# Patient Record
Sex: Female | Born: 1995 | Race: White | Hispanic: No | Marital: Married | Smoking: Never smoker
Health system: Southern US, Community
[De-identification: ages and names within clinical notes are randomized; demographics above are authoritative.]

## PROBLEM LIST (undated history)

## (undated) DIAGNOSIS — K219 Gastro-esophageal reflux disease without esophagitis: Secondary | ICD-10-CM

## (undated) DIAGNOSIS — R51 Headache: Secondary | ICD-10-CM

## (undated) DIAGNOSIS — Z23 Encounter for immunization: Secondary | ICD-10-CM

## (undated) DIAGNOSIS — R519 Headache, unspecified: Secondary | ICD-10-CM

## (undated) DIAGNOSIS — R112 Nausea with vomiting, unspecified: Secondary | ICD-10-CM

## (undated) DIAGNOSIS — K259 Gastric ulcer, unspecified as acute or chronic, without hemorrhage or perforation: Secondary | ICD-10-CM

## (undated) DIAGNOSIS — M2550 Pain in unspecified joint: Secondary | ICD-10-CM

## (undated) DIAGNOSIS — K828 Other specified diseases of gallbladder: Secondary | ICD-10-CM

## (undated) DIAGNOSIS — J302 Other seasonal allergic rhinitis: Secondary | ICD-10-CM

## (undated) DIAGNOSIS — Z9889 Other specified postprocedural states: Secondary | ICD-10-CM

## (undated) DIAGNOSIS — Z8489 Family history of other specified conditions: Secondary | ICD-10-CM

## (undated) DIAGNOSIS — Z8669 Personal history of other diseases of the nervous system and sense organs: Secondary | ICD-10-CM

## (undated) HISTORY — PX: HIP SURGERY: SHX245

## (undated) HISTORY — DX: Encounter for immunization: Z23

## (undated) HISTORY — PX: COLONOSCOPY WITH ESOPHAGOGASTRODUODENOSCOPY (EGD): SHX5779

## (undated) HISTORY — PX: SHOULDER SURGERY: SHX246

---
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
## 2018-09-08 ENCOUNTER — Other Ambulatory Visit: Payer: Self-pay | Admitting: Neurology

## 2018-09-08 DIAGNOSIS — G44041 Chronic paroxysmal hemicrania, intractable: Secondary | ICD-10-CM

## 2018-09-22 ENCOUNTER — Ambulatory Visit
Admission: RE | Admit: 2018-09-22 | Discharge: 2018-09-22 | Disposition: A | Discharge status: Home / Self Care | Payer: BC Managed Care – PPO | Source: Ambulatory Visit | Attending: Neurology | Admitting: Neurology

## 2018-09-22 ENCOUNTER — Other Ambulatory Visit: Payer: Self-pay

## 2018-09-22 DIAGNOSIS — G44041 Chronic paroxysmal hemicrania, intractable: Secondary | ICD-10-CM | POA: Diagnosis present

## 2018-11-01 ENCOUNTER — Other Ambulatory Visit: Payer: Self-pay

## 2018-11-01 DIAGNOSIS — Z20822 Contact with and (suspected) exposure to covid-19: Secondary | ICD-10-CM

## 2018-11-05 LAB — NOVEL CORONAVIRUS, NAA: SARS-CoV-2, NAA: NOT DETECTED

## 2019-05-10 ENCOUNTER — Ambulatory Visit: Payer: Self-pay | Admitting: Obstetrics and Gynecology

## 2019-05-12 NOTE — Progress Notes (Signed)
PCP:  Luna Fuse, MD   Chief Complaint  Patient presents with  . Gynecologic Exam     HPI:      [name] is a 24 y.o. No obstetric history on file. who LMP was Patient's last menstrual period was 04/28/2019 (approximate)., presents today for her NP annual examination.  Her menses are regular every 28-30 days, lasting 4 days.  Dysmenorrhea none. She does not have intermenstrual bleeding.  Sex activity: not currently but has in past - OCP (estrogen/progesterone).  Last Pap: never Hx of STDs: none  Pt has had issues with yeast vag over the past yr. Usually has d/c with irritation, sometimes odor. Treated with monistat, sometimes with relief. No sx today. Uses regular soap, no dryer sheets, no thong use, no wipes.   There is a FH of breast cancer in [facility] and 2 pat 2nd cousins, genetic testing not indicated for pt. There is no FH of ovarian cancer. The patient does do self-breast exams.  Tobacco use: The patient denies current or previous tobacco use. Alcohol use: none No drug use.  Exercise: moderately active. Recently had hip surgery so plans to be active again after recovery.  She does get adequate calcium but not Vitamin D in her diet. Gardasil completed.   Pt with anxiety/depression sx, on zoloft 50 mg daily. Having increased anxiety and depression since moving to [place] for grad school and boyfriend deployed. Doesn't feel dose is controlling sx. No side effects. Wants [place] to manage since had been seeing peds.   Past Medical History:  Diagnosis Date  . Biliary dyskinesia 01/25/2016  . Family history of adverse reaction to anesthesia    dad gets sick with anesthesia  . Gastric ulcer   . GERD (gastroesophageal reflux disease)    takes Pantoprazole daily  . Headache   . History of migraine    few yrs ago  . Joint pain   . PONV (postoperative nausea and vomiting)   . Seasonal allergies    Tylenol Sinus and Nasonex as needed  . Vaccine for human papilloma virus  (HPV) types 6, 11, 16, and 18 administered      Past Surgical History:  Procedure Laterality Date  . CHOLECYSTECTOMY N/A 01/25/2016   Procedure: LAPAROSCOPIC CHOLECYSTECTOMY WITH INTRAOPERATIVE CHOLANGIOGRAM;  Surgeon: Fanny Skates, MD;  Location: [facility];  Service: General;  Laterality: N/A;  . COLONOSCOPY WITH ESOPHAGOGASTRODUODENOSCOPY (EGD)    . HIP SURGERY Bilateral    labrum repair  . SHOULDER SURGERY Bilateral    labrum repair    History reviewed. No pertinent family history.  Social History   Socioeconomic History  . Marital status: Single    Spouse name: Not on file  . Number of children: Not on file  . Years of education: Not on file  . Highest education level: Not on file  Occupational History  . Not on file  Tobacco Use  . Smoking status: Never Smoker  . Smokeless tobacco: Never Used  Substance and Sexual Activity  . Alcohol use: No  . Drug use: No  . Sexual activity: Not Currently    Birth control/protection: Pill  Other Topics Concern  . Not on file  Social History Narrative  . Not on file   Social Determinants of Health   Financial Resource Strain:   . Difficulty of Paying Living Expenses: Not on file  Food Insecurity:   . Worried About Charity fundraiser in the Last Year: Not on file  .  Ran Out of Food in the Last Year: Not on file  Transportation Needs:   . Lack of Transportation (Medical): Not on file  . Lack of Transportation (Non-Medical): Not on file  Physical Activity:   . Days of Exercise per Week: Not on file  . Minutes of Exercise per Session: Not on file  Stress:   . Feeling of Stress : Not on file  Social Connections:   . Frequency of Communication with Friends and Family: Not on file  . Frequency of Social Gatherings with Friends and Family: Not on file  . Attends Religious Services: Not on file  . Active Member of Clubs or Organizations: Not on file  . Attends Banker Meetings: Not on file  . Marital Status: Not  on file  Intimate Partner Violence:   . Fear of Current or Ex-Partner: Not on file  . Emotionally Abused: Not on file  . Physically Abused: Not on file  . Sexually Abused: Not on file     Current Outpatient Medications:  .  cetirizine (ZYRTEC) 10 MG tablet, Take 10 mg by mouth daily as needed for allergies. , Disp: , Rfl:  .  Cyanocobalamin (VITAMIN B-12 PO), Take 1 tablet by mouth daily., Disp: , Rfl:  .  Melatonin 3 MG TABS, Take by mouth., Disp: , Rfl:  .  mometasone (NASONEX) 50 MCG/ACT nasal spray, Place 2 sprays into the nose daily as needed (allergies)., Disp: , Rfl:  .  ondansetron (ZOFRAN) 4 MG tablet, Take 4 mg by mouth daily as needed., Disp: , Rfl:  .  pantoprazole (PROTONIX) 40 MG tablet, Take by mouth., Disp: , Rfl:  .  pseudoephedrine-acetaminophen (TYLENOL SINUS) 30-500 MG TABS tablet, Take 2 tablets by mouth 2 (two) times daily as needed (cold symptoms)., Disp: , Rfl:  .  sertraline (ZOLOFT) 50 MG tablet, Take 1.5 tablets (75 mg total) by mouth daily., Disp: 135 tablet, Rfl: 3 .  SPRINTEC 28 0.25-35 MG-MCG tablet, Take 1 tablet by mouth daily., Disp: 3 Package, Rfl: 3 .  sucralfate (CARAFATE) 1 g tablet, Take 1 g by mouth 3 (three) times daily as needed., Disp: , Rfl:      ROS:  Review of Systems  Constitutional: Negative for fatigue, fever and unexpected weight change.  Respiratory: Negative for cough, shortness of breath and wheezing.   Cardiovascular: Negative for chest pain, palpitations and leg swelling.  Gastrointestinal: Negative for blood in stool, constipation, diarrhea, nausea and vomiting.  Endocrine: Negative for cold intolerance, heat intolerance and polyuria.  Genitourinary: Positive for vaginal discharge. Negative for dyspareunia, dysuria, flank pain, frequency, genital sores, hematuria, menstrual problem, pelvic pain, urgency, vaginal bleeding and vaginal pain.  Musculoskeletal: Negative for back pain, joint swelling and myalgias.  Skin: Negative for  rash.  Neurological: Negative for dizziness, syncope, light-headedness, numbness and headaches.  Hematological: Negative for adenopathy.  Psychiatric/Behavioral: Positive for agitation and dysphoric mood. Negative for confusion, sleep disturbance and suicidal ideas. The patient is not nervous/anxious.   BREAST: No symptoms   Objective: BP 90/60   Ht 5\' 5"  (1.651 m)   Wt 143 lb (64.9 kg)   LMP 04/28/2019 (Approximate)   BMI 23.80 kg/m    Physical Exam Constitutional:      Appearance: She is well-developed.  Genitourinary:     Vulva, vagina, cervix, uterus, right adnexa and left adnexa normal.     No vulval lesion or tenderness noted.     No vaginal discharge, erythema or tenderness.  No cervical polyp.     Uterus is not enlarged or tender.     No right or left adnexal mass present.     Right adnexa not tender.     Left adnexa not tender.  Neck:     Thyroid: No thyromegaly.  Cardiovascular:     Rate and Rhythm: Normal rate and regular rhythm.     Heart sounds: Normal heart sounds. No murmur.  Pulmonary:     Effort: Pulmonary effort is normal.     Breath sounds: Normal breath sounds.  Chest:     Breasts:        Right: No mass, nipple discharge, skin change or tenderness.        Left: No mass, nipple discharge, skin change or tenderness.  Abdominal:     Palpations: Abdomen is soft.     Tenderness: There is no abdominal tenderness. There is no guarding.  Musculoskeletal:        General: Normal range of motion.     Cervical back: Normal range of motion.  Neurological:     General: No focal deficit present.     Mental Status: She is alert and oriented to person, place, and time.     Cranial Nerves: No cranial nerve deficit.  Skin:    General: Skin is warm and dry.  Psychiatric:        Mood and Affect: Mood normal.        Behavior: Behavior normal.        Thought Content: Thought content normal.        Judgment: Judgment normal.  Vitals reviewed.     Results:  GAD 7 : Generalized Anxiety Score 05/13/2019  Nervous, Anxious, on Edge 1  Control/stop worrying 2  Worry too much - different things 2  Trouble relaxing 2  Restless 1  Easily annoyed or irritable 0  Afraid - awful might happen 1  Total GAD 7 Score 9  Anxiety Difficulty Very difficult    Depression screen PHQ 2/9 05/13/2019  Decreased Interest 1  Down, Depressed, Hopeless 2  PHQ - 2 Score 3  Altered sleeping 3  Tired, decreased energy 2  Change in appetite 3  Feeling bad or failure about yourself  2  Trouble concentrating 1  Moving slowly or fidgety/restless 0  Suicidal thoughts 0  PHQ-9 Score 14  Difficult doing work/chores Very difficult    Results for orders placed or performed in visit on 05/13/19 (from the past 24 hour(s))  POCT Wet Prep with KOH     Status: Normal   Collection Time: 05/13/19 11:20 AM  Result Value Ref Range   Trichomonas, UA Negative    Clue Cells Wet Prep HPF POC neg    Epithelial Wet Prep HPF POC     Yeast Wet Prep HPF POC neg    Bacteria Wet Prep HPF POC     RBC Wet Prep HPF POC     WBC Wet Prep HPF POC     KOH Prep POC Negative Negative     Assessment/Plan: Encounter for annual routine gynecological examination  Cervical cancer screening - Plan: Cytology - PAP  Screening for STD (sexually transmitted disease) - Plan: Cytology - PAP  Encounter for surveillance of contraceptive pills - Plan: SPRINTEC 28 0.25-35 MG-MCG tablet; OCP RF.  Anxiety and depression - Plan: sertraline (ZOLOFT) 50 MG tablet, Increase to 75 mg dose for 4 wks. If sx controlled, cont dose. If still not improved, will increase to 100  mg dose. Pt to f/u via phone/mychart since in [place]. Rx eRxd.   Vaginal discharge - Plan: POCT Wet Prep with KOH; neg wet prep. No sx today anyway. Change to dove sens skin soap. F/u prn. If gets BV sx, will treat empirically since in [place]. If no relief, then pt can f/u with Gyn there.   Meds ordered this encounter  Medications  . SPRINTEC 28  0.25-35 MG-MCG tablet    Sig: Take 1 tablet by mouth daily.    Dispense:  3 Package    Refill:  3    Order Specific Question:   Supervising Provider    Answer:   Nadara Mustard [id number]  . sertraline (ZOLOFT) 50 MG tablet    Sig: Take 1.5 tablets (75 mg total) by mouth daily.    Dispense:  135 tablet    Refill:  3    Order Specific Question:   Supervising Provider    Answer:   Nadara Mustard [[id number]]             GYN counsel adequate intake of calcium and vitamin D, diet and exercise     F/U  Return in about 1 year (around 05/12/2020).  Brentlee Delage B. Consuelo Suthers, PA-C 05/13/2019 12:01 PM

## 2019-05-13 ENCOUNTER — Other Ambulatory Visit (HOSPITAL_COMMUNITY)
Admission: RE | Admit: 2019-05-13 | Discharge: 2019-05-13 | Disposition: A | Discharge status: Home / Self Care | Payer: BC Managed Care – PPO | Source: Ambulatory Visit | Attending: Obstetrics and Gynecology | Admitting: Obstetrics and Gynecology

## 2019-05-13 ENCOUNTER — Encounter: Payer: Self-pay | Admitting: Obstetrics and Gynecology

## 2019-05-13 ENCOUNTER — Ambulatory Visit (INDEPENDENT_AMBULATORY_CARE_PROVIDER_SITE_OTHER): Payer: BC Managed Care – PPO | Admitting: Obstetrics and Gynecology

## 2019-05-13 ENCOUNTER — Other Ambulatory Visit: Payer: Self-pay

## 2019-05-13 VITALS — BP 90/60 | Ht 65.0 in | Wt 143.0 lb

## 2019-05-13 DIAGNOSIS — Z124 Encounter for screening for malignant neoplasm of cervix: Secondary | ICD-10-CM | POA: Diagnosis not present

## 2019-05-13 DIAGNOSIS — Z113 Encounter for screening for infections with a predominantly sexual mode of transmission: Secondary | ICD-10-CM | POA: Insufficient documentation

## 2019-05-13 DIAGNOSIS — Z01419 Encounter for gynecological examination (general) (routine) without abnormal findings: Secondary | ICD-10-CM

## 2019-05-13 DIAGNOSIS — N898 Other specified noninflammatory disorders of vagina: Secondary | ICD-10-CM | POA: Diagnosis not present

## 2019-05-13 DIAGNOSIS — Z3041 Encounter for surveillance of contraceptive pills: Secondary | ICD-10-CM

## 2019-05-13 DIAGNOSIS — F419 Anxiety disorder, unspecified: Secondary | ICD-10-CM | POA: Diagnosis not present

## 2019-05-13 DIAGNOSIS — F32A Depression, unspecified: Secondary | ICD-10-CM | POA: Insufficient documentation

## 2019-05-13 DIAGNOSIS — F329 Major depressive disorder, single episode, unspecified: Secondary | ICD-10-CM | POA: Insufficient documentation

## 2019-05-13 LAB — POCT WET PREP WITH KOH
Clue Cells Wet Prep HPF POC: NEGATIVE
KOH Prep POC: NEGATIVE
Trichomonas, UA: NEGATIVE
Yeast Wet Prep HPF POC: NEGATIVE

## 2019-05-13 MED ORDER — SPRINTEC 28 0.25-35 MG-MCG PO TABS: 1.0000 | ORAL_TABLET | Freq: Every day | ORAL | 3 refills | Status: DC

## 2019-05-13 MED ORDER — SERTRALINE HCL 50 MG PO TABS: 75.0000 mg | ORAL_TABLET | Freq: Every day | ORAL | 3 refills | Status: DC

## 2019-05-13 NOTE — Patient Instructions (Signed)
I value your feedback and entrusting us with your care. If you get a [facility] patient survey, I would appreciate you taking the time to let us know about your experience today. Thank you!  As of March 24, 2019, your lab results will be released to your MyChart immediately, before I even have a chance to see them. Please give me time to review them and contact you if there are any abnormalities. Thank you for your patience.  

## 2019-05-16 LAB — CYTOLOGY - PAP
Adequacy: ABSENT
Chlamydia: NEGATIVE
Comment: NEGATIVE
Comment: NORMAL
Diagnosis: NEGATIVE
Neisseria Gonorrhea: NEGATIVE

## 2019-07-03 ENCOUNTER — Encounter: Payer: Self-pay | Admitting: Obstetrics and Gynecology

## 2019-07-03 DIAGNOSIS — Z1321 Encounter for screening for nutritional disorder: Secondary | ICD-10-CM

## 2019-07-03 DIAGNOSIS — Z1329 Encounter for screening for other suspected endocrine disorder: Secondary | ICD-10-CM

## 2019-07-03 DIAGNOSIS — K2950 Unspecified chronic gastritis without bleeding: Secondary | ICD-10-CM

## 2019-07-03 DIAGNOSIS — Z0000 Encounter for general adult medical examination without abnormal findings: Secondary | ICD-10-CM

## 2019-07-13 ENCOUNTER — Other Ambulatory Visit: Payer: Self-pay

## 2019-07-13 ENCOUNTER — Other Ambulatory Visit: Payer: BC Managed Care – PPO

## 2019-07-13 DIAGNOSIS — K2950 Unspecified chronic gastritis without bleeding: Secondary | ICD-10-CM

## 2019-07-13 DIAGNOSIS — Z1329 Encounter for screening for other suspected endocrine disorder: Secondary | ICD-10-CM

## 2019-07-13 DIAGNOSIS — Z0000 Encounter for general adult medical examination without abnormal findings: Secondary | ICD-10-CM

## 2019-07-13 DIAGNOSIS — Z1321 Encounter for screening for nutritional disorder: Secondary | ICD-10-CM

## 2019-07-19 ENCOUNTER — Encounter: Payer: Self-pay | Admitting: Obstetrics and Gynecology

## 2019-07-20 NOTE — Telephone Encounter (Signed)
Pls f/u with pt to get her copy of the labs [facility] printed off. Fax?, Send her a pic through MyCH[facility]? Fax to her provider? Thx.

## 2019-07-22 LAB — CBC WITH DIFFERENTIAL/PLATELET
Basophils Absolute: 0 10*3/uL (ref 0.0–0.2)
Basos: 0 %
EOS (ABSOLUTE): 0.2 10*3/uL (ref 0.0–0.4)
Eos: 5 %
Hematocrit: 40.2 % (ref 34.0–46.6)
Hemoglobin: 13.5 g/dL (ref 11.1–15.9)
Immature Grans (Abs): 0 10*3/uL (ref 0.0–0.1)
Immature Granulocytes: 0 %
Lymphocytes Absolute: 2.3 10*3/uL (ref 0.7–3.1)
Lymphs: 52 %
MCH: 29.2 pg (ref 26.6–33.0)
MCHC: 33.6 g/dL (ref 31.5–35.7)
MCV: 87 fL (ref 79–97)
Monocytes Absolute: 0.4 10*3/uL (ref 0.1–0.9)
Monocytes: 10 %
Neutrophils Absolute: 1.5 10*3/uL (ref 1.4–7.0)
Neutrophils: 33 %
Platelets: 174 10*3/uL (ref 150–450)
RBC: 4.62 x10E6/uL (ref 3.77–5.28)
RDW: 13.5 % (ref 11.7–15.4)
WBC: 4.5 10*3/uL (ref 3.4–10.8)

## 2019-07-22 LAB — COMPREHENSIVE METABOLIC PANEL
ALT: 21 IU/L (ref 0–32)
AST: 24 IU/L (ref 0–40)
Albumin/Globulin Ratio: 1.3 (ref 1.2–2.2)
Albumin: 4.4 g/dL (ref 3.9–5.0)
Alkaline Phosphatase: 141 IU/L — ABNORMAL HIGH (ref 39–117)
BUN/Creatinine Ratio: 12 (ref 9–23)
BUN: 11 mg/dL (ref 6–20)
Bilirubin Total: 0.6 mg/dL (ref 0.0–1.2)
CO2: 24 mmol/L (ref 20–29)
Calcium: 9.5 mg/dL (ref 8.7–10.2)
Chloride: 103 mmol/L (ref 96–106)
Creatinine, Ser: 0.93 mg/dL (ref 0.57–1.00)
GFR calc Af Amer: 101 mL/min/{1.73_m2} (ref >59)
GFR calc non Af Amer: 88 mL/min/{1.73_m2} (ref >59)
Globulin, Total: 3.3 g/dL (ref 1.5–4.5)
Glucose: 83 mg/dL (ref 65–99)
Potassium: 4.4 mmol/L (ref 3.5–5.2)
Sodium: 140 mmol/L (ref 134–144)
Total Protein: 7.7 g/dL (ref 6.0–8.5)

## 2019-07-22 LAB — THYROID PANEL WITH TSH
Free Thyroxine Index: 2.1 (ref 1.2–4.9)
T3 Uptake Ratio: 25 % (ref 24–39)
T4, Total: 8.2 ug/dL (ref 4.5–12.0)
TSH: 1.95 u[IU]/mL (ref 0.450–4.500)

## 2019-07-22 LAB — B12 AND FOLATE PANEL
Folate: 5.4 ng/mL (ref >3.0)
Vitamin B-12: 615 pg/mL (ref 232–1245)

## 2019-07-22 LAB — THYROID PEROXIDASE ANTIBODY: Thyroperoxidase Ab SerPl-aCnc: 23 IU/mL (ref 0–34)

## 2019-07-22 LAB — LIPID PANEL
Chol/HDL Ratio: 3.2 ratio (ref 0.0–4.4)
Cholesterol, Total: 172 mg/dL (ref 100–199)
HDL: 54 mg/dL (ref >39)
LDL Chol Calc (NIH): 96 mg/dL (ref 0–99)
Triglycerides: 122 mg/dL (ref 0–149)
VLDL Cholesterol Cal: 22 mg/dL (ref 5–40)

## 2019-07-22 LAB — VITAMIN D 25 HYDROXY (VIT D DEFICIENCY, FRACTURES): Vit D, 25-Hydroxy: 40.1 ng/mL (ref 30.0–100.0)

## 2019-07-22 LAB — HOMOCYSTEINE: Homocysteine: 9.7 umol/L (ref 0.0–14.5)

## 2019-07-22 LAB — THYROGLOBULIN LEVEL: Thyroglobulin (TG-RIA): 2.2 ng/mL

## 2019-07-22 LAB — T3, REVERSE: Reverse T3, Serum: 15.8 ng/dL (ref 9.2–24.1)

## 2019-08-09 ENCOUNTER — Encounter: Payer: Self-pay | Admitting: Obstetrics and Gynecology

## 2019-08-09 NOTE — Telephone Encounter (Signed)
Pls fax results to pt's provider--see msg. Thx.

## 2019-08-10 NOTE — Telephone Encounter (Signed)
Done

## 2019-10-20 ENCOUNTER — Other Ambulatory Visit: Payer: Self-pay

## 2019-10-20 ENCOUNTER — Ambulatory Visit (INDEPENDENT_AMBULATORY_CARE_PROVIDER_SITE_OTHER): Payer: BC Managed Care – PPO | Admitting: Dermatology

## 2019-10-20 DIAGNOSIS — I781 Nevus, non-neoplastic: Secondary | ICD-10-CM

## 2019-10-20 NOTE — Progress Notes (Signed)
   Follow-Up Visit   Subjective  Alexa Grant is a 24 y.o. female who presents for the following: Telangiectasia (R infra ocular, nasal tip - present for many years, patient would like to discuss treatment options).  The following portions of the chart were reviewed this encounter and updated as appropriate:     Review of Systems:  No other skin or systemic complaints except as noted in HPI or Assessment and Plan.  Objective  Well appearing patient in no apparent distress; mood and affect are within normal limits.  A focused examination was performed including the face. Relevant physical exam findings are noted in the Assessment and Plan.  Objective  R infraocular, R nasal tip: Blanching pink macules  Images        Assessment & Plan  Telangiectasia R infraocular, R nasal tip  Discussed BBL laser treatment with patient (recommend 1-3). Pamphlet given today. Rec no tan on face prior to procedure.  Multiple treatments may be required to clear.  Advised patient BBL is a cosmetic procedure that is not covered by health insurance. Boneta Lucks Jahoo's information gave to patient to schedule procedure at her other location, since she is unable to come Mon/Tues due to work schedule.  Discussed ED (small risk of scar) and cost of $60 for first lesion then $15 for each thereafter. BBL would be $200 per treatment (less risk for scar)  Recommend daily broad spectrum sunscreen SPF 30+ to sun-exposed areas, reapply every 2 hours as needed.  Return if symptoms worsen or fail to improve, for cosmetic - laser treatment for telangiectasias.  Maylene Roes, CMA, am acting as scribe for Willeen Niece, MD .  Documentation: I have reviewed the above documentation for accuracy and completeness, and I agree with the above.  Willeen Niece MD

## 2019-10-31 ENCOUNTER — Ambulatory Visit (INDEPENDENT_AMBULATORY_CARE_PROVIDER_SITE_OTHER): Payer: Self-pay

## 2019-10-31 ENCOUNTER — Other Ambulatory Visit: Payer: Self-pay

## 2019-10-31 DIAGNOSIS — I781 Nevus, non-neoplastic: Secondary | ICD-10-CM

## 2020-05-30 ENCOUNTER — Other Ambulatory Visit: Payer: Self-pay | Admitting: Obstetrics and Gynecology

## 2020-05-30 DIAGNOSIS — Z3041 Encounter for surveillance of contraceptive pills: Secondary | ICD-10-CM

## 2020-06-08 ENCOUNTER — Other Ambulatory Visit: Payer: Self-pay | Admitting: Obstetrics and Gynecology

## 2020-06-08 DIAGNOSIS — F32A Depression, unspecified: Secondary | ICD-10-CM

## 2020-06-14 ENCOUNTER — Telehealth: Payer: Self-pay

## 2020-06-14 NOTE — Telephone Encounter (Signed)
Pt calling; has scheduled appt; needs refill of zoloft to get her to appt.  [phone]

## 2020-06-15 ENCOUNTER — Other Ambulatory Visit: Payer: Self-pay | Admitting: Obstetrics and Gynecology

## 2020-06-15 DIAGNOSIS — F419 Anxiety disorder, unspecified: Secondary | ICD-10-CM

## 2020-06-15 DIAGNOSIS — F32A Depression, unspecified: Secondary | ICD-10-CM

## 2020-06-15 MED ORDER — SERTRALINE HCL 50 MG PO TABS: 75.0000 mg | ORAL_TABLET | Freq: Every day | ORAL | 0 refills | Status: DC

## 2020-06-15 NOTE — Telephone Encounter (Signed)
Called pt, no answer, left msg "Rx has been sent to pharmacy".

## 2020-06-15 NOTE — Telephone Encounter (Signed)
Rx RF eRxd.  

## 2020-06-15 NOTE — Progress Notes (Signed)
Rx RF sertraline till 5/22 annual

## 2020-08-10 NOTE — Progress Notes (Signed)
PCP:  [name], MD   Chief Complaint  Patient presents with  . Gynecologic Exam    Anxiety qs     HPI:      [name] is a 25 y.o. No obstetric history on file. who LMP was Patient's last menstrual period was 07/30/2020 (approximate)., presents today for her annual examination.  Her menses are regular every 28-30 days, lasting 5 days.  Dysmenorrhea none. She does not have intermenstrual bleeding.  Sex activity: not currently but has in past - OCP (estrogen/progesterone).  Last Pap: 05/13/19 Results were no abnormalities Hx of STDs: none  There is a FH of breast cancer in [facility] and 2 pat 2nd cousins, genetic testing not indicated for pt. There is no FH of ovarian cancer. The patient does do self-breast exams.  Tobacco use: The patient denies current or previous tobacco use. Alcohol use: none No drug use.  Exercise: moderately active. Recently had hip surgery so plans to be active again after recovery.  She does get adequate calcium but not Vitamin D in her diet. Gardasil completed.   Pt with anxiety/depression sx, on zoloft 75 mg daily, dose increased last yr due to increased anxiety and depression since moving to [place] for grad school. Sx worse when in [place] and improved when here. Will be moving back here this fall and thinks her sx will be improved. Seeing therapist in [place] that she doesn't "love" but is still seeing her. No side effects.   Past Medical History:  Diagnosis Date  . Biliary dyskinesia 01/25/2016  . Family history of adverse reaction to anesthesia    dad gets sick with anesthesia  . Gastric ulcer   . GERD (gastroesophageal reflux disease)    takes Pantoprazole daily  . Headache   . History of migraine    few yrs ago  . Joint pain   . PONV (postoperative nausea and vomiting)   . Seasonal allergies    Tylenol Sinus and Nasonex as needed  . Vaccine for human papilloma virus (HPV) types 6, 11, 16, and 18 administered      Past Surgical History:   Procedure Laterality Date  . CHOLECYSTECTOMY N/A 01/25/2016   Procedure: LAPAROSCOPIC CHOLECYSTECTOMY WITH INTRAOPERATIVE CHOLANGIOGRAM;  Surgeon: Claud Kelp, MD;  Location: [facility];  Service: General;  Laterality: N/A;  . COLONOSCOPY WITH ESOPHAGOGASTRODUODENOSCOPY (EGD)    . HIP SURGERY Bilateral    labrum repair  . SHOULDER SURGERY Bilateral    labrum repair    Family History  Problem Relation Age of Onset  . Breast cancer Paternal Aunt        not sure of age    Social History   Socioeconomic History  . Marital status: Single    Spouse name: Not on file  . Number of children: Not on file  . Years of education: Not on file  . Highest education level: Not on file  Occupational History  . Not on file  Tobacco Use  . Smoking status: Never Smoker  . Smokeless tobacco: Never Used  Vaping Use  . Vaping Use: Never used  Substance and Sexual Activity  . Alcohol use: No  . Drug use: No  . Sexual activity: Not Currently    Birth control/protection: Pill  Other Topics Concern  . Not on file  Social History Narrative  . Not on file   Social Determinants of Health   Financial Resource Strain: Not on file  Food Insecurity: Not on file  Transportation  Needs: Not on file  Physical Activity: Not on file  Stress: Not on file  Social Connections: Not on file  Intimate Partner Violence: Not on file     Current Outpatient Medications:  .  cetirizine (ZYRTEC) 10 MG tablet, Take 10 mg by mouth daily as needed for allergies. , Disp: , Rfl:  .  Cyanocobalamin (VITAMIN B-12 PO), Take 1 tablet by mouth daily., Disp: , Rfl:  .  mometasone (NASONEX) 50 MCG/ACT nasal spray, Place 2 sprays into the nose daily as needed (allergies)., Disp: , Rfl:  .  pseudoephedrine-acetaminophen (TYLENOL SINUS) 30-500 MG TABS tablet, Take 2 tablets by mouth 2 (two) times daily as needed (cold symptoms)., Disp: , Rfl:  .  sertraline (ZOLOFT) 100 MG tablet, Take 1 tablet (100 mg total) by mouth daily.,  Disp: 90 tablet, Rfl: 3 .  SPRINTEC 28 0.25-35 MG-MCG tablet, Take 1 tablet by mouth daily., Disp: 84 tablet, Rfl: 3     ROS:  Review of Systems  Constitutional: Negative for fatigue, fever and unexpected weight change.  Respiratory: Negative for cough, shortness of breath and wheezing.   Cardiovascular: Negative for chest pain, palpitations and leg swelling.  Gastrointestinal: Negative for blood in stool, constipation, diarrhea, nausea and vomiting.  Endocrine: Negative for cold intolerance, heat intolerance and polyuria.  Genitourinary: Negative for dyspareunia, dysuria, flank pain, frequency, genital sores, hematuria, menstrual problem, pelvic pain, urgency, vaginal bleeding, vaginal discharge and vaginal pain.  Musculoskeletal: Negative for back pain, joint swelling and myalgias.  Skin: Negative for rash.  Neurological: Negative for dizziness, syncope, light-headedness, numbness and headaches.  Hematological: Negative for adenopathy.  Psychiatric/Behavioral: Positive for agitation and dysphoric mood. Negative for confusion, sleep disturbance and suicidal ideas. The patient is not nervous/anxious.   BREAST: No symptoms   Objective: BP 100/60   Ht 5\' 5"  (1.651 m)   Wt 146 lb (66.2 kg)   LMP 07/30/2020 (Approximate)   BMI 24.30 kg/m    Physical Exam Constitutional:      Appearance: She is well-developed.  Genitourinary:     Vulva normal.     No vaginal discharge, erythema or tenderness.      Right Adnexa: not tender and no mass present.    Left Adnexa: not tender and no mass present.    No cervical polyp.     Uterus is not enlarged or tender.  Breasts:     Right: No mass, nipple discharge, skin change or tenderness.     Left: No mass, nipple discharge, skin change or tenderness.    Neck:     Thyroid: No thyromegaly.  Cardiovascular:     Rate and Rhythm: Normal rate and regular rhythm.     Heart sounds: Normal heart sounds. No murmur heard.   Pulmonary:      Effort: Pulmonary effort is normal.     Breath sounds: Normal breath sounds.  Abdominal:     Palpations: Abdomen is soft.     Tenderness: There is no abdominal tenderness. There is no guarding.  Musculoskeletal:        General: Normal range of motion.     Cervical back: Normal range of motion.  Neurological:     General: No focal deficit present.     Mental Status: She is alert and oriented to person, place, and time.     Cranial Nerves: No cranial nerve deficit.  Skin:    General: Skin is warm and dry.  Psychiatric:        Mood and  Affect: Mood normal.        Behavior: Behavior normal.        Thought Content: Thought content normal.        Judgment: Judgment normal.  Vitals reviewed.     Results: GAD 7 : Generalized Anxiety Score 08/13/2020 05/13/2019  Nervous, Anxious, on Edge 3 1  Control/stop worrying 3 2  Worry too much - different things 3 2  Trouble relaxing 3 2  Restless 2 1  Easily annoyed or irritable 2 0  Afraid - awful might happen 2 1  Total GAD 7 Score 18 9  Anxiety Difficulty Very difficult Very difficult    Depression screen PHQ 2/9 08/13/2020  Decreased Interest 2  Down, Depressed, Hopeless 2  PHQ - 2 Score 4  Altered sleeping 3  Tired, decreased energy 2  Change in appetite 3  Feeling bad or failure about yourself  3  Trouble concentrating 3  Moving slowly or fidgety/restless 1  Suicidal thoughts 0  PHQ-9 Score 19  Difficult doing work/chores Very difficult    Assessment/Plan: Encounter for annual routine gynecological examination  Encounter for surveillance of contraceptive pills - Plan: SPRINTEC 28 0.25-35 MG-MCG tablet; OCP RF  Anxiety and depression - Plan: sertraline (ZOLOFT) 100 MG tablet; increased sx recently, no SI. Will increase zoloft to 100 mg dose. Rx eRxd. Can discuss decreasing dose once she moves back here in 3-4 months. Cont seeing therapist if possible.    Meds ordered this encounter  Medications  . SPRINTEC 28 0.25-35 MG-MCG  tablet    Sig: Take 1 tablet by mouth daily.    Dispense:  84 tablet    Refill:  3    Order Specific Question:   Supervising Provider    Answer:   Nadara Mustard [id number]  . sertraline (ZOLOFT) 100 MG tablet    Sig: Take 1 tablet (100 mg total) by mouth daily.    Dispense:  90 tablet    Refill:  3    Order Specific Question:   Supervising Provider    Answer:   Nadara Mustard [[id number]]             GYN counsel adequate intake of calcium and vitamin D, diet and exercise     F/U  Return in about 1 year (around 08/13/2021).  Kayzen Kendzierski B. Aliany Fiorenza, PA-C 08/13/2020 2:32 PM

## 2020-08-13 ENCOUNTER — Ambulatory Visit (INDEPENDENT_AMBULATORY_CARE_PROVIDER_SITE_OTHER): Payer: No Typology Code available for payment source | Admitting: Obstetrics and Gynecology

## 2020-08-13 ENCOUNTER — Encounter: Payer: Self-pay | Admitting: Obstetrics and Gynecology

## 2020-08-13 ENCOUNTER — Other Ambulatory Visit: Payer: Self-pay

## 2020-08-13 VITALS — BP 100/60 | Ht 65.0 in | Wt 146.0 lb

## 2020-08-13 DIAGNOSIS — F32A Depression, unspecified: Secondary | ICD-10-CM

## 2020-08-13 DIAGNOSIS — F419 Anxiety disorder, unspecified: Secondary | ICD-10-CM

## 2020-08-13 DIAGNOSIS — Z01419 Encounter for gynecological examination (general) (routine) without abnormal findings: Secondary | ICD-10-CM | POA: Diagnosis not present

## 2020-08-13 DIAGNOSIS — Z113 Encounter for screening for infections with a predominantly sexual mode of transmission: Secondary | ICD-10-CM

## 2020-08-13 DIAGNOSIS — Z3041 Encounter for surveillance of contraceptive pills: Secondary | ICD-10-CM | POA: Diagnosis not present

## 2020-08-13 MED ORDER — SERTRALINE HCL 100 MG PO TABS: 100.0000 mg | ORAL_TABLET | Freq: Every day | ORAL | 3 refills | Status: DC

## 2020-08-13 MED ORDER — SPRINTEC 28 0.25-35 MG-MCG PO TABS: 1.0000 | ORAL_TABLET | Freq: Every day | ORAL | 3 refills | Status: DC

## 2020-08-13 NOTE — Patient Instructions (Signed)
I value your feedback and you entrusting us with your care. If you get a [facility] patient survey, I would appreciate you taking the time to let us know about your experience today. Thank you! ? ? ?

## 2020-08-21 ENCOUNTER — Encounter: Payer: Self-pay | Admitting: Obstetrics and Gynecology

## 2020-08-26 ENCOUNTER — Other Ambulatory Visit: Payer: Self-pay | Admitting: Obstetrics and Gynecology

## 2020-08-26 DIAGNOSIS — Z3041 Encounter for surveillance of contraceptive pills: Secondary | ICD-10-CM

## 2020-08-26 MED ORDER — NORGESTIMATE-ETH ESTRADIOL 0.25-35 MG-MCG PO TABS: 1.0000 | ORAL_TABLET | Freq: Every day | ORAL | 3 refills | Status: DC

## 2020-10-21 ENCOUNTER — Encounter: Payer: Self-pay | Admitting: Obstetrics and Gynecology

## 2020-11-26 NOTE — Progress Notes (Signed)
[name], MD   Chief Complaint  Patient presents with   Breast Exam    LB tenderness, areola change in size x 1 mont    HPI:      [name] is a 25 y.o. G0P0000 whose LMP was Patient's last menstrual period was 11/19/2020 (exact date)., presents today for LT breast changes over the past month. Has noticed her LT areola seems larger and darker, but she has also noticed her LT breast has gotten a little larger with diet/exercise changes recently. No masses, erythema, prior trauma, nipple d/c. Drinks caffeine a few times a wk.  FH breast cancer in her pat grt aunt and her 2 daughters (2nd cousins to pt). Pt unsure if genetic testing done.  Last annual 5/22.  Past Medical History:  Diagnosis Date   Biliary dyskinesia 01/25/2016   Family history of adverse reaction to anesthesia    dad gets sick with anesthesia   Gastric ulcer    GERD (gastroesophageal reflux disease)    takes Pantoprazole daily   Headache    History of migraine    few yrs ago   Joint pain    PONV (postoperative nausea and vomiting)    Seasonal allergies    Tylenol Sinus and Nasonex as needed   Vaccine for human papilloma virus (HPV) types 6, 11, 16, and 18 administered     Past Surgical History:  Procedure Laterality Date   CHOLECYSTECTOMY N/A 01/25/2016   Procedure: LAPAROSCOPIC CHOLECYSTECTOMY WITH INTRAOPERATIVE CHOLANGIOGRAM;  Surgeon: Claud Kelp, MD;  Location: [facility] OR;  Service: General;  Laterality: N/A;   COLONOSCOPY WITH ESOPHAGOGASTRODUODENOSCOPY (EGD)     HIP SURGERY Bilateral    labrum repair   SHOULDER SURGERY Bilateral    labrum repair    Family History  Problem Relation Age of Onset   Breast cancer Paternal Aunt        not sure of age    Social History   Socioeconomic History   Marital status: Single    Spouse name: Not on file   Number of children: Not on file   Years of education: Not on file   Highest education level: Not on file  Occupational History    Not on file  Tobacco Use   Smoking status: Never   Smokeless tobacco: Never  Vaping Use   Vaping Use: Never used  Substance and Sexual Activity   Alcohol use: No   Drug use: No   Sexual activity: Not Currently    Birth control/protection: Pill  Other Topics Concern   Not on file  Social History Narrative   Not on file   Social Determinants of Health   Financial Resource Strain: Not on file  Food Insecurity: Not on file  Transportation Needs: Not on file  Physical Activity: Not on file  Stress: Not on file  Social Connections: Not on file  Intimate Partner Violence: Not on file    Outpatient Medications Prior to Visit  Medication Sig Dispense Refill   cetirizine (ZYRTEC) 10 MG tablet Take 10 mg by mouth daily as needed for allergies.      Cyanocobalamin (VITAMIN B-12 PO) Take 1 tablet by mouth daily.     mometasone (NASONEX) 50 MCG/ACT nasal spray Place 2 sprays into the nose daily as needed (allergies).     norgestimate-ethinyl estradiol (SPRINTEC 28) 0.25-35 MG-MCG tablet Take 1 tablet by mouth daily. 84 tablet 3   pseudoephedrine-acetaminophen (TYLENOL SINUS) 30-500 MG TABS tablet Take 2  tablets by mouth 2 (two) times daily as needed (cold symptoms).     sertraline (ZOLOFT) 100 MG tablet Take 1 tablet (100 mg total) by mouth daily. 90 tablet 3   No facility-administered medications prior to visit.      ROS:  Review of Systems  Constitutional:  Negative for fever.  Gastrointestinal:  Negative for blood in stool, constipation, diarrhea, nausea and vomiting.  Genitourinary:  Negative for dyspareunia, dysuria, flank pain, frequency, hematuria, urgency, vaginal bleeding, vaginal discharge and vaginal pain.  Musculoskeletal:  Negative for back pain.  Skin:  Negative for rash.  BREAST: CHANGE   OBJECTIVE:   Vitals:  BP 100/70   Ht 5\' 5"  (1.651 m)   Wt 150 lb (68 kg)   LMP 11/19/2020 (Exact Date)   BMI 24.96 kg/m   Physical Exam Vitals reviewed.  Pulmonary:      Effort: Pulmonary effort is normal.  Chest:  Breasts:    Breasts are symmetrical.     Right: No inverted nipple, mass, nipple discharge, skin change or tenderness.     Left: No inverted nipple, mass, nipple discharge, skin change or tenderness.     Comments: BILAT BREASTS WNL; AREOLA NORMAL IN APPEARANCE BILAT Musculoskeletal:        General: Normal range of motion.     Cervical back: Normal range of motion.  Skin:    General: Skin is warm and dry.  Neurological:     General: No focal deficit present.     Mental Status: She is alert and oriented to person, place, and time.     Cranial Nerves: No cranial nerve deficit.  Psychiatric:        Mood and Affect: Mood normal.        Behavior: Behavior normal.        Thought Content: Thought content normal.        Judgment: Judgment normal.    Assessment/Plan: Normal breast exam--neg breast exam bilat. Reassurance. F/u prn.   Breast skin changes  Family history of breast cancer--her 2nd cousins qualify for cancer genetic testing. Pt can see if it has been done; otherwise, current FH doesn't affect pt and not candidate for [facility] testing at this time.    Return if symptoms worsen or fail to improve.  Lupe Bonner B. Anneka Studer, PA-C 11/27/2020 10:22 AM

## 2020-11-27 ENCOUNTER — Encounter: Payer: Self-pay | Admitting: Obstetrics and Gynecology

## 2020-11-27 ENCOUNTER — Other Ambulatory Visit: Payer: Self-pay

## 2020-11-27 ENCOUNTER — Ambulatory Visit (INDEPENDENT_AMBULATORY_CARE_PROVIDER_SITE_OTHER): Payer: No Typology Code available for payment source | Admitting: Obstetrics and Gynecology

## 2020-11-27 VITALS — BP 100/70 | Ht 65.0 in | Wt 150.0 lb

## 2020-11-27 DIAGNOSIS — Z0000 Encounter for general adult medical examination without abnormal findings: Secondary | ICD-10-CM

## 2020-11-27 DIAGNOSIS — R234 Changes in skin texture: Secondary | ICD-10-CM | POA: Diagnosis not present

## 2021-02-26 IMAGING — MR MRI HEAD WITHOUT CONTRAST
11 series · 44 of 48 positions shown · non-contrast
Comparison: CT head 06/28/2015.

CLINICAL DATA: Intractable chronic paroxysmal hemicrania. Severe
daily migraines for 1 year.

EXAM:
MRI HEAD WITHOUT CONTRAST
TECHNIQUE: Multiplanar, multiecho pulse sequences of the brain and surrounding
structures were obtained without intravenous contrast.

[Series 5: ax dwi_tracew · axial · 3.0mm · 0.60mm/px · z∈[-16,+145]mm · 4 of 55 slices shown]
[im 1/55]
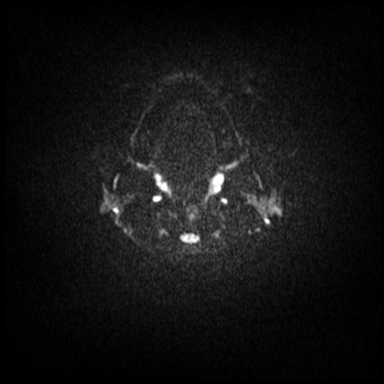
[im 19/55]
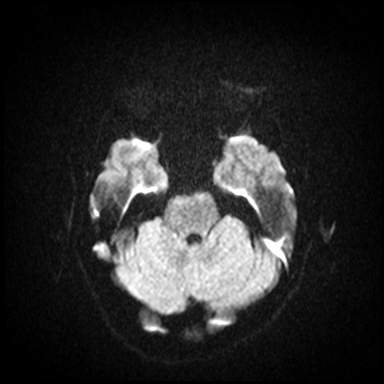
[im 37/55]
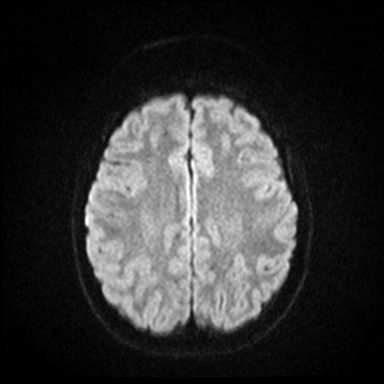
[im 55/55]
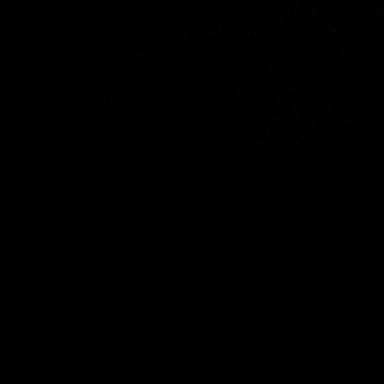

[Series 6: ax dwi_adc · axial · 3.0mm · 0.60mm/px · z∈[-16,+136]mm · 4 of 52 slices shown]
[im 1/52]
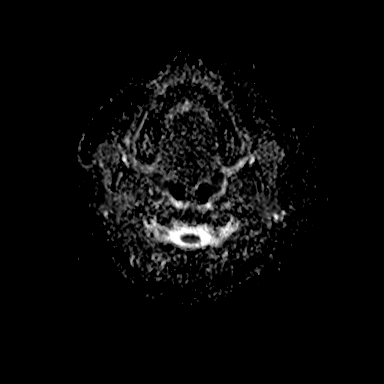
[im 18/52]
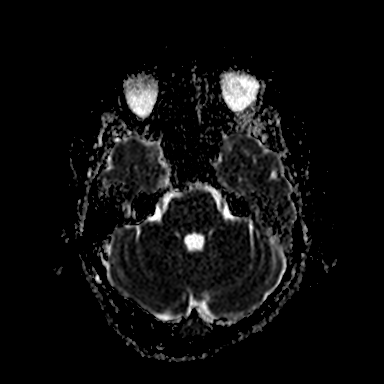
[im 35/52]
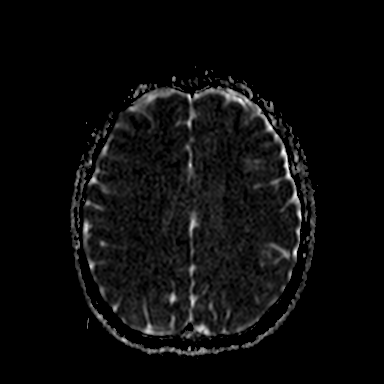
[im 52/52]
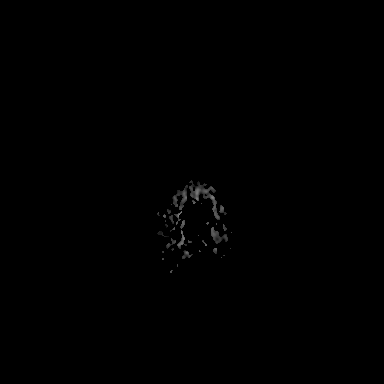

[Series 7: cor dwi_tracew · coronal · 5.0mm · 0.60mm/px · 3 of 37 slices shown]
[im 1/37]
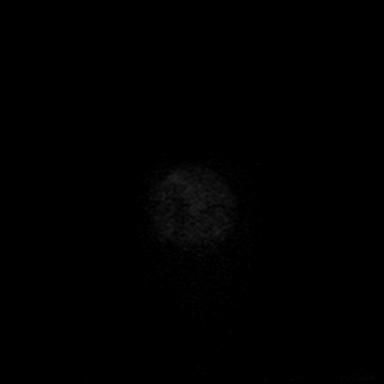
[im 19/37]
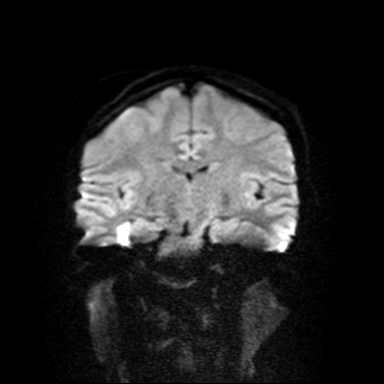
[im 37/37]
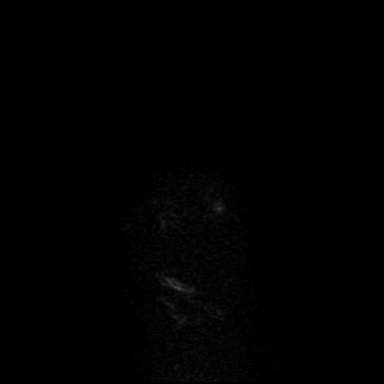

[Series 8: cor dwi_adc · coronal · 5.0mm · 0.60mm/px · 3 of 37 slices shown]
[im 1/37]
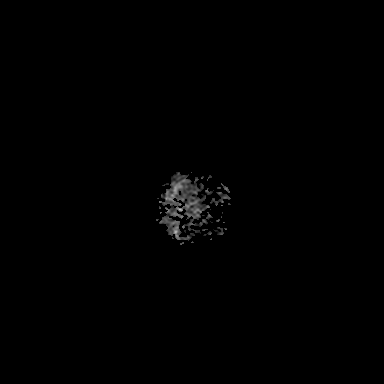
[im 19/37]
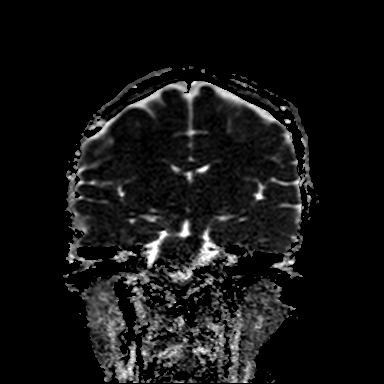
[im 37/37]
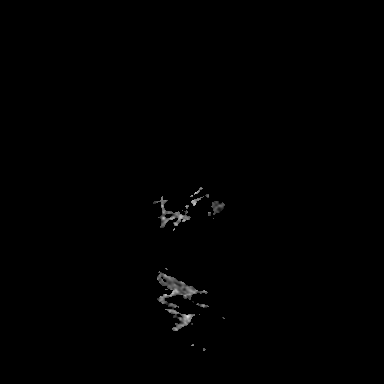

[Series 9: T1 · sagittal · 5.0mm · 0.62mm/px · 2 of 21 slices shown (1 of 2)]
[im 1/21]
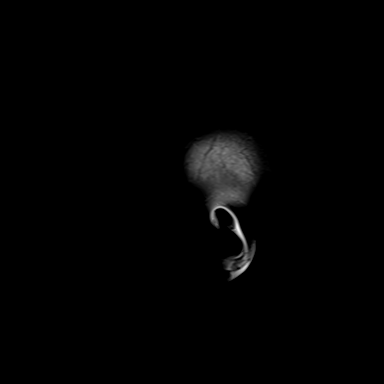
[im 21/21]
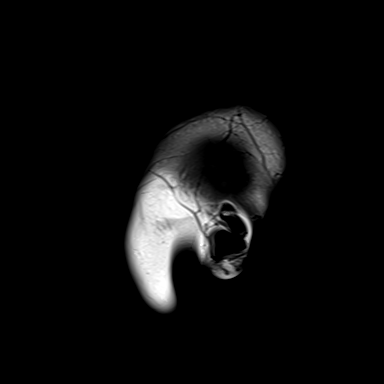

[Series 10: T2 · axial · 5.0mm · 0.53mm/px · z∈[-15,+141]mm · 2 of 27 slices shown (1 of 2)]
[im 1/27]
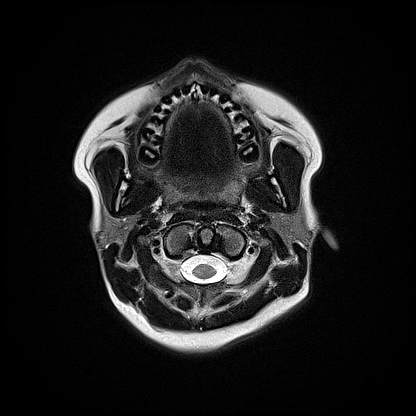
[im 27/27]
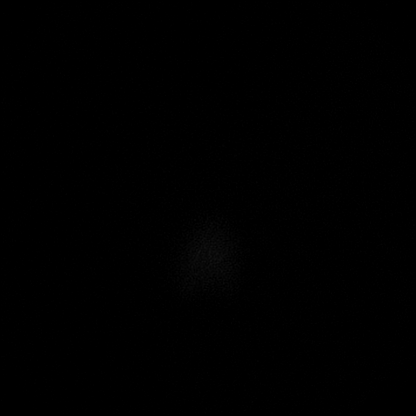

[Series 12: pha_images · axial · 3.0mm · 0.90mm/px · z∈[-25,+149]mm · 5 of 59 slices shown]
[im 1/59]
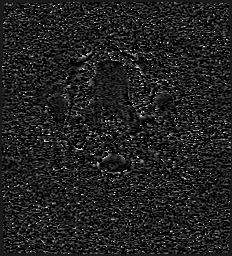
[im 15/59]
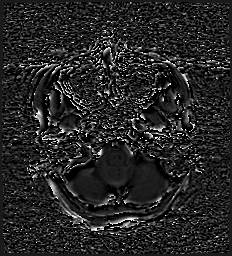
[im 30/59]
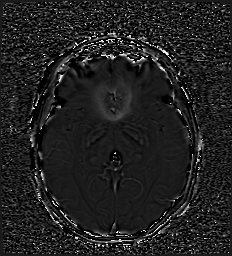
[im 44/59]
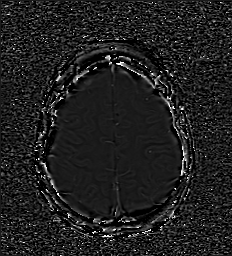
[im 59/59]
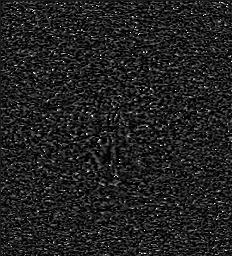

[Series 13: swi_images · axial · 3.0mm · 0.90mm/px · z∈[-25,+152]mm · 5 of 60 slices shown]
[im 1/60]
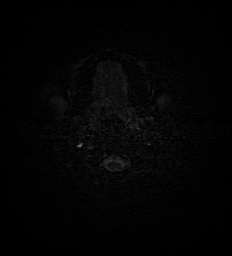
[im 15/60]
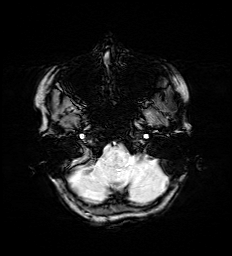
[im 30/60]
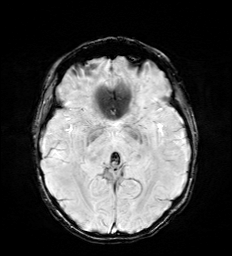
[im 45/60]
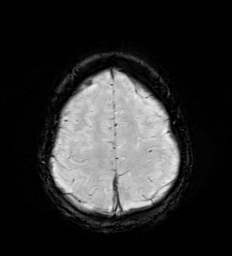
[im 60/60]
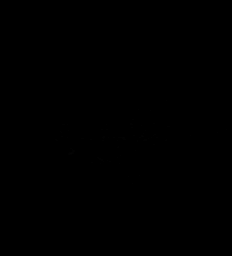

[Series 15: FLAIR · axial · 3.0mm · 0.53mm/px · z∈[-18,+141]mm · 4 of 54 slices shown]
[im 1/54]
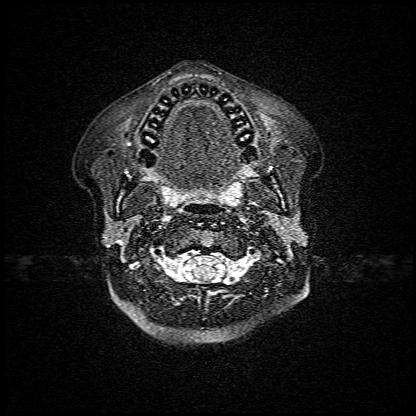
[im 18/54]
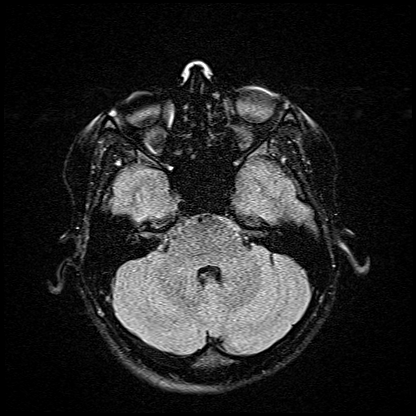
[im 36/54]
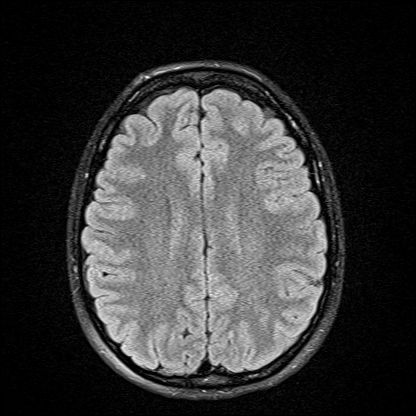
[im 54/54]
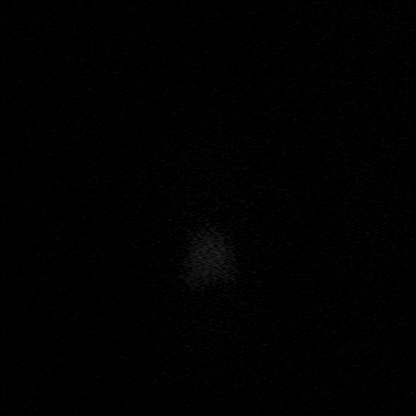

[Series 16: T1 · axial · 1.0mm · 0.98mm/px · z∈[-24,+150]mm · 10 of 171 slices shown (2 of 2)]
[im 1/171]
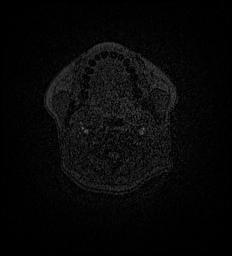
[im 14/171]
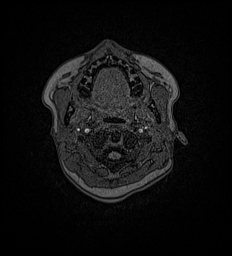
[im 27/171]
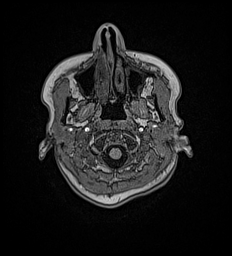
[im 40/171]
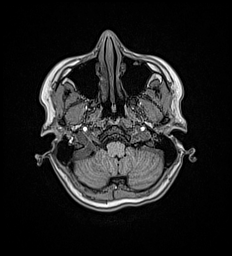
[im 53/171]
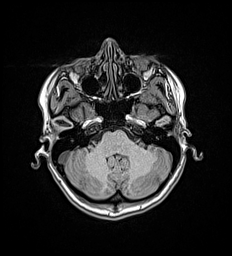
[im 79/171]
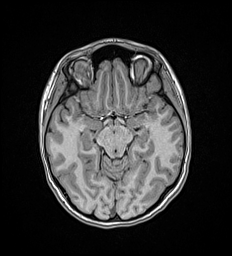
[im 92/171]
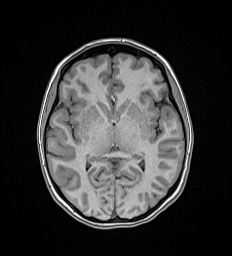
[im 118/171]
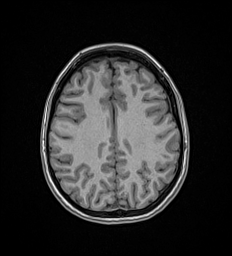
[im 144/171]
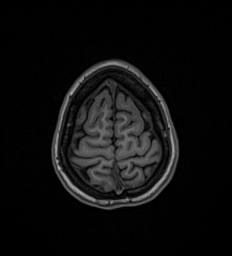
[im 171/171]
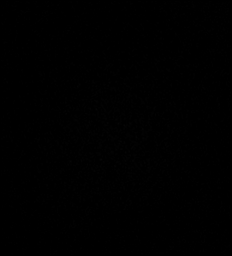

[Series 17: T2 · coronal · 5.0mm · 0.57mm/px · 2 of 29 slices shown (2 of 2)]
[im 1/29]
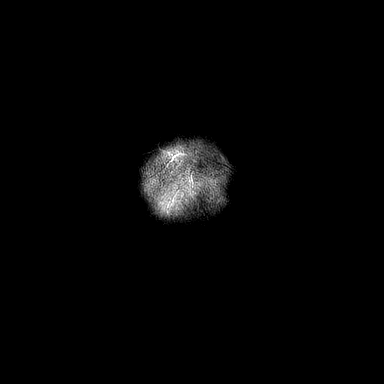
[im 29/29]
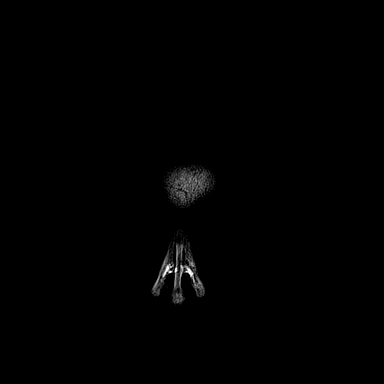

[44 of 48 positions shown; findings below may reference images not displayed]

FINDINGS: Brain: No evidence for acute infarction, hemorrhage, mass lesion,
hydrocephalus, or extra-axial fluid. Normal cerebral volume. No
white matter disease.

Vascular: Flow voids are maintained throughout the carotid, basilar,
and vertebral arteries. There are no areas of chronic hemorrhage.

Skull and upper cervical spine: Unremarkable visualized calvarium,
skullbase, and cervical vertebrae. Pituitary, pineal, cerebellar
tonsils unremarkable. No upper cervical cord lesions.

Sinuses/Orbits: No orbital masses or proptosis. Globes appear
symmetric. Sinuses appear well aerated, without evidence for
air-fluid level.

Other: No nasopharyngeal pathology or mastoid fluid. Scalp and other
visualized extracranial soft tissues grossly unremarkable.
IMPRESSION: Negative exam.

## 2021-04-14 HISTORY — PX: HIP SURGERY: SHX245

## 2021-05-16 ENCOUNTER — Other Ambulatory Visit: Payer: Self-pay | Admitting: Obstetrics and Gynecology

## 2021-05-16 DIAGNOSIS — F419 Anxiety disorder, unspecified: Secondary | ICD-10-CM

## 2021-05-16 DIAGNOSIS — F32A Depression, unspecified: Secondary | ICD-10-CM

## 2021-06-17 ENCOUNTER — Other Ambulatory Visit: Payer: Self-pay | Admitting: Obstetrics and Gynecology

## 2021-06-17 DIAGNOSIS — F419 Anxiety disorder, unspecified: Secondary | ICD-10-CM

## 2021-07-20 ENCOUNTER — Other Ambulatory Visit: Payer: Self-pay | Admitting: Obstetrics and Gynecology

## 2021-07-20 DIAGNOSIS — F419 Anxiety disorder, unspecified: Secondary | ICD-10-CM

## 2021-08-08 ENCOUNTER — Telehealth: Payer: Self-pay | Admitting: Family Medicine

## 2021-08-08 NOTE — Telephone Encounter (Signed)
Left message for pt to call back to schedule her annual in May 2023.   ?

## 2021-08-22 ENCOUNTER — Other Ambulatory Visit: Payer: Self-pay | Admitting: Obstetrics and Gynecology

## 2021-08-22 DIAGNOSIS — F419 Anxiety disorder, unspecified: Secondary | ICD-10-CM

## 2021-08-26 ENCOUNTER — Other Ambulatory Visit: Payer: Self-pay | Admitting: Obstetrics and Gynecology

## 2021-08-26 DIAGNOSIS — F419 Anxiety disorder, unspecified: Secondary | ICD-10-CM

## 2021-09-24 ENCOUNTER — Telehealth: Payer: Self-pay

## 2021-09-24 ENCOUNTER — Other Ambulatory Visit: Payer: Self-pay

## 2021-09-24 DIAGNOSIS — F419 Anxiety disorder, unspecified: Secondary | ICD-10-CM

## 2021-09-24 MED ORDER — SERTRALINE HCL 100 MG PO TABS: 100.0000 mg | ORAL_TABLET | Freq: Every day | ORAL | 3 refills | Status: DC

## 2021-09-24 NOTE — Telephone Encounter (Signed)
Refilled zoloft for pt. She has appt sheduled

## 2021-09-24 NOTE — Telephone Encounter (Signed)
Yes

## 2021-09-24 NOTE — Telephone Encounter (Signed)
Pt needing refill on her Zoloft, is that okay for me to refill? She has appt 11/21/2021

## 2021-10-28 ENCOUNTER — Other Ambulatory Visit: Payer: Self-pay

## 2021-10-28 ENCOUNTER — Telehealth: Payer: Self-pay

## 2021-10-28 DIAGNOSIS — Z3041 Encounter for surveillance of contraceptive pills: Secondary | ICD-10-CM

## 2021-10-28 MED ORDER — NORGESTIMATE-ETH ESTRADIOL 0.25-35 MG-MCG PO TABS: 1.0000 | ORAL_TABLET | Freq: Every day | ORAL | 0 refills | Status: DC

## 2021-10-28 NOTE — Telephone Encounter (Signed)
Alexa Grant called triage requesting a refill on her birth control to hold her over until her annual that's on 11/21/21 with [name].

## 2021-10-28 NOTE — Telephone Encounter (Signed)
Called [place] and advised her that I could call in one refill until her appointment. Confirmed pharmacy and we changed it to [facility] on [address].

## 2021-11-20 NOTE — Progress Notes (Unsigned)
PCP:  Luna Fuse, MD   No chief complaint on file.    HPI:      [name] is a 26 y.o. No obstetric history on file. who LMP was No LMP recorded., presents today for her annual examination.  Her menses are regular every 28-30 days, lasting 5 days.  Dysmenorrhea none. She does not have intermenstrual bleeding.  Sex activity: not currently but has in past - OCP (estrogen/progesterone).  Last Pap: 05/13/19 Results were no abnormalities Hx of STDs: none  There is a FH of breast cancer in [facility] and 2 pat 2nd cousins, genetic testing not indicated for pt. There is no FH of ovarian cancer. The patient does do self-breast exams.  Tobacco use: The patient denies current or previous tobacco use. Alcohol use: none No drug use.  Exercise: moderately active. Recently had hip surgery so plans to be active again after recovery.  She does get adequate calcium but not Vitamin D in her diet. Gardasil completed.   Pt with anxiety/depression sx, on zoloft 75 mg daily, dose increased last yr due to increased anxiety and depression since moving to [place] for grad school. Sx worse when in [place] and improved when here. Will be moving back here this fall and thinks her sx will be improved. Seeing therapist in [place] that she doesn't "love" but is still seeing her. No side effects.   Past Medical History:  Diagnosis Date   Biliary dyskinesia 01/25/2016   Family history of adverse reaction to anesthesia    dad gets sick with anesthesia   Gastric ulcer    GERD (gastroesophageal reflux disease)    takes Pantoprazole daily   Headache    History of migraine    few yrs ago   Joint pain    PONV (postoperative nausea and vomiting)    Seasonal allergies    Tylenol Sinus and Nasonex as needed   Vaccine for human papilloma virus (HPV) types 6, 11, 16, and 18 administered      Past Surgical History:  Procedure Laterality Date   CHOLECYSTECTOMY N/A 01/25/2016   Procedure: LAPAROSCOPIC CHOLECYSTECTOMY  WITH INTRAOPERATIVE CHOLANGIOGRAM;  Surgeon: Fanny Skates, MD;  Location: [facility] OR;  Service: General;  Laterality: N/A;   COLONOSCOPY WITH ESOPHAGOGASTRODUODENOSCOPY (EGD)     HIP SURGERY Bilateral    labrum repair   SHOULDER SURGERY Bilateral    labrum repair    Family History  Problem Relation Age of Onset   Breast cancer Paternal 22        not sure of age    Social History   Socioeconomic History   Marital status: Single    Spouse name: Not on file   Number of children: Not on file   Years of education: Not on file   Highest education level: Not on file  Occupational History   Not on file  Tobacco Use   Smoking status: Never   Smokeless tobacco: Never  Vaping Use   Vaping Use: Never used  Substance and Sexual Activity   Alcohol use: No   Drug use: No   Sexual activity: Not Currently    Birth control/protection: Pill  Other Topics Concern   Not on file  Social History Narrative   Not on file   Social Determinants of Health   Financial Resource Strain: Not on file  Food Insecurity: Not on file  Transportation Needs: Not on file  Physical Activity: Not on file  Stress: Not on file  Social  Connections: Not on file  Intimate Partner Violence: Not on file     Current Outpatient Medications:    cetirizine (ZYRTEC) 10 MG tablet, Take 10 mg by mouth daily as needed for allergies. , Disp: , Rfl:    Cyanocobalamin (VITAMIN B-12 PO), Take 1 tablet by mouth daily., Disp: , Rfl:    mometasone (NASONEX) 50 MCG/ACT nasal spray, Place 2 sprays into the nose daily as needed (allergies)., Disp: , Rfl:    norgestimate-ethinyl estradiol (SPRINTEC 28) 0.25-35 MG-MCG tablet, Take 1 tablet by mouth daily., Disp: 30 tablet, Rfl: 0   pseudoephedrine-acetaminophen (TYLENOL SINUS) 30-500 MG TABS tablet, Take 2 tablets by mouth 2 (two) times daily as needed (cold symptoms)., Disp: , Rfl:    sertraline (ZOLOFT) 100 MG tablet, Take 1 tablet (100 mg total) by mouth daily., Disp: 90  tablet, Rfl: 3     ROS:  Review of Systems  Constitutional:  Negative for fatigue, fever and unexpected weight change.  Respiratory:  Negative for cough, shortness of breath and wheezing.   Cardiovascular:  Negative for chest pain, palpitations and leg swelling.  Gastrointestinal:  Negative for blood in stool, constipation, diarrhea, nausea and vomiting.  Endocrine: Negative for cold intolerance, heat intolerance and polyuria.  Genitourinary:  Negative for dyspareunia, dysuria, flank pain, frequency, genital sores, hematuria, menstrual problem, pelvic pain, urgency, vaginal bleeding, vaginal discharge and vaginal pain.  Musculoskeletal:  Negative for back pain, joint swelling and myalgias.  Skin:  Negative for rash.  Neurological:  Negative for dizziness, syncope, light-headedness, numbness and headaches.  Hematological:  Negative for adenopathy.  Psychiatric/Behavioral:  Positive for agitation and dysphoric mood. Negative for confusion, sleep disturbance and suicidal ideas. The patient is not nervous/anxious.   BREAST: No symptoms   Objective: There were no vitals taken for this visit.   Physical Exam Constitutional:      Appearance: She is well-developed.  Genitourinary:     Vulva normal.     No vaginal discharge, erythema or tenderness.      Right Adnexa: not tender and no mass present.    Left Adnexa: not tender and no mass present.    No cervical polyp.     Uterus is not enlarged or tender.  Breasts:    Right: No mass, nipple discharge, skin change or tenderness.     Left: No mass, nipple discharge, skin change or tenderness.  Neck:     Thyroid: No thyromegaly.  Cardiovascular:     Rate and Rhythm: Normal rate and regular rhythm.     Heart sounds: Normal heart sounds. No murmur heard. Pulmonary:     Effort: Pulmonary effort is normal.     Breath sounds: Normal breath sounds.  Abdominal:     Palpations: Abdomen is soft.     Tenderness: There is no abdominal  tenderness. There is no guarding.  Musculoskeletal:        General: Normal range of motion.     Cervical back: Normal range of motion.  Neurological:     General: No focal deficit present.     Mental Status: She is alert and oriented to person, place, and time.     Cranial Nerves: No cranial nerve deficit.  Skin:    General: Skin is warm and dry.  Psychiatric:        Mood and Affect: Mood normal.        Behavior: Behavior normal.        Thought Content: Thought content normal.  Judgment: Judgment normal.  Vitals reviewed.     Results:    08/13/2020    2:30 PM 05/13/2019   11:58 AM  GAD 7 : Generalized Anxiety Score  Nervous, Anxious, on Edge 3 1  Control/stop worrying 3 2  Worry too much - different things 3 2  Trouble relaxing 3 2  Restless 2 1  Easily annoyed or irritable 2 0  Afraid - awful might happen 2 1  Total GAD 7 Score 18 9  Anxiety Difficulty Very difficult Very difficult       08/13/2020    2:29 PM  Depression screen PHQ 2/9  Decreased Interest 2  Down, Depressed, Hopeless 2  PHQ - 2 Score 4  Altered sleeping 3  Tired, decreased energy 2  Change in appetite 3  Feeling bad or failure about yourself  3  Trouble concentrating 3  Moving slowly or fidgety/restless 1  Suicidal thoughts 0  PHQ-9 Score 19  Difficult doing work/chores Very difficult    Assessment/Plan: Encounter for annual routine gynecological examination  Encounter for surveillance of contraceptive pills - Plan: SPRINTEC 28 0.25-35 MG-MCG tablet; OCP RF  Anxiety and depression - Plan: sertraline (ZOLOFT) 100 MG tablet; increased sx recently, no SI. Will increase zoloft to 100 mg dose. Rx eRxd. Can discuss decreasing dose once she moves back here in 3-4 months. Cont seeing therapist if possible.    No orders of the defined types were placed in this encounter.            GYN counsel adequate intake of calcium and vitamin D, diet and exercise     F/U  No follow-ups on  file.  Dequavious Harshberger B. Fenna Semel, PA-C 11/20/2021 8:20 PM

## 2021-11-21 ENCOUNTER — Encounter: Payer: Self-pay | Admitting: Obstetrics and Gynecology

## 2021-11-21 ENCOUNTER — Other Ambulatory Visit (HOSPITAL_COMMUNITY)
Admission: RE | Admit: 2021-11-21 | Discharge: 2021-11-21 | Disposition: A | Discharge status: Home / Self Care | Payer: No Typology Code available for payment source | Source: Ambulatory Visit | Attending: Obstetrics and Gynecology | Admitting: Obstetrics and Gynecology

## 2021-11-21 ENCOUNTER — Ambulatory Visit (INDEPENDENT_AMBULATORY_CARE_PROVIDER_SITE_OTHER): Payer: No Typology Code available for payment source | Admitting: Obstetrics and Gynecology

## 2021-11-21 VITALS — BP 108/60 | Ht 65.0 in | Wt 144.0 lb

## 2021-11-21 DIAGNOSIS — F32A Depression, unspecified: Secondary | ICD-10-CM

## 2021-11-21 DIAGNOSIS — Z3041 Encounter for surveillance of contraceptive pills: Secondary | ICD-10-CM | POA: Diagnosis not present

## 2021-11-21 DIAGNOSIS — Z113 Encounter for screening for infections with a predominantly sexual mode of transmission: Secondary | ICD-10-CM

## 2021-11-21 DIAGNOSIS — F419 Anxiety disorder, unspecified: Secondary | ICD-10-CM

## 2021-11-21 DIAGNOSIS — Z01419 Encounter for gynecological examination (general) (routine) without abnormal findings: Secondary | ICD-10-CM

## 2021-11-21 MED ORDER — NORGESTIMATE-ETH ESTRADIOL 0.25-35 MG-MCG PO TABS: 1.0000 | ORAL_TABLET | Freq: Every day | ORAL | 3 refills | Status: DC

## 2021-11-21 MED ORDER — SERTRALINE HCL 100 MG PO TABS: 100.0000 mg | ORAL_TABLET | Freq: Every day | ORAL | 3 refills | Status: DC

## 2021-11-21 NOTE — Patient Instructions (Signed)
I value your feedback and you entrusting us with your care. If you get a [facility] patient survey, I would appreciate you taking the time to let us know about your experience today. Thank you! ? ? ?

## 2021-11-23 ENCOUNTER — Other Ambulatory Visit: Payer: Self-pay | Admitting: Obstetrics and Gynecology

## 2021-11-23 DIAGNOSIS — Z3041 Encounter for surveillance of contraceptive pills: Secondary | ICD-10-CM

## 2021-11-25 LAB — CERVICOVAGINAL ANCILLARY ONLY
Chlamydia: NEGATIVE
Comment: NEGATIVE
Comment: NORMAL
Neisseria Gonorrhea: NEGATIVE

## 2022-06-03 ENCOUNTER — Encounter: Payer: Self-pay | Admitting: Obstetrics and Gynecology

## 2022-10-06 ENCOUNTER — Other Ambulatory Visit: Payer: Self-pay | Admitting: Obstetrics and Gynecology

## 2022-10-06 DIAGNOSIS — F419 Anxiety disorder, unspecified: Secondary | ICD-10-CM

## 2022-10-06 MED ORDER — SERTRALINE HCL 100 MG PO TABS
100.0000 mg | ORAL_TABLET | Freq: Every day | ORAL | 0 refills | Status: DC
Start: 2022-10-06 — End: 2023-01-05

## 2022-10-06 NOTE — Progress Notes (Signed)
Rx RF sertraline till 8/24 annual

## 2022-11-16 ENCOUNTER — Encounter: Payer: Self-pay | Admitting: Obstetrics and Gynecology

## 2022-11-16 DIAGNOSIS — O200 Threatened abortion: Secondary | ICD-10-CM

## 2022-11-17 ENCOUNTER — Telehealth: Payer: Self-pay

## 2022-11-17 NOTE — Telephone Encounter (Signed)
This would just be a problem GYN visit. However we do need to find out how far along she is.

## 2022-11-18 NOTE — Telephone Encounter (Signed)
I contacted the patient via phone. I left message for the patient to contact our office. I have her scheduled for 8/19 with Dr. Nicholaus Bloom at 9:35 am to follow up. I was going to advise the patient to contact centralized scheduling for ultrasound appointment prior to being seen in office.

## 2022-11-18 NOTE — Telephone Encounter (Signed)
The patient has been contacted about ultrasound appointment scheduled for 8/7 at 1 pm at [facility]. She is also aware we have her scheduled for 8/19 with Dr. Marice Potter for follow up visit.

## 2022-11-18 NOTE — Telephone Encounter (Signed)
Order placed

## 2022-11-19 ENCOUNTER — Ambulatory Visit
Admission: RE | Admit: 2022-11-19 | Discharge: 2022-11-19 | Disposition: A | Discharge status: Home / Self Care | Payer: BC Managed Care – PPO | Source: Ambulatory Visit | Attending: Obstetrics and Gynecology | Admitting: Obstetrics and Gynecology

## 2022-11-19 DIAGNOSIS — O200 Threatened abortion: Secondary | ICD-10-CM | POA: Insufficient documentation

## 2022-11-20 ENCOUNTER — Other Ambulatory Visit: Payer: Self-pay | Admitting: Obstetrics and Gynecology

## 2022-11-20 DIAGNOSIS — O3680X0 Pregnancy with inconclusive fetal viability, not applicable or unspecified: Secondary | ICD-10-CM

## 2022-11-24 ENCOUNTER — Telehealth: Payer: Self-pay | Admitting: Obstetrics and Gynecology

## 2022-11-24 NOTE — Telephone Encounter (Signed)
I contacted the patient via phone. I left message advising the patient to contact our office to confirm scheduling ultrasound. We have the patient scheduled for Friday, 12/05/22 at 9:15 am. This was the next opening in the scheduled to offer. Awaiting patient's confirmation.

## 2022-11-24 NOTE — Progress Notes (Signed)
I contacted the patient via phone. I left voicemail advising the patient of appointment needed. Will send mychart message.

## 2022-11-24 NOTE — Telephone Encounter (Signed)
-----   Message from [place] sent at 11/20/2022 10:06 AM EDT ----- Please have patient scheduled for ultrasound in office in 2 weeks for viability. I know she has an appt with Dr. Marice Potter on 8/19, not sure if it can be performed then or maybe later in that week.

## 2022-11-25 NOTE — Telephone Encounter (Signed)
Closing note 

## 2022-12-01 ENCOUNTER — Encounter: Payer: Self-pay | Admitting: Obstetrics & Gynecology

## 2022-12-01 ENCOUNTER — Other Ambulatory Visit: Payer: BC Managed Care – PPO

## 2022-12-01 ENCOUNTER — Ambulatory Visit (INDEPENDENT_AMBULATORY_CARE_PROVIDER_SITE_OTHER): Payer: BC Managed Care – PPO | Admitting: Obstetrics & Gynecology

## 2022-12-01 VITALS — BP 108/59 | HR 94 | Ht 65.0 in | Wt 149.0 lb

## 2022-12-01 DIAGNOSIS — O26899 Other specified pregnancy related conditions, unspecified trimester: Secondary | ICD-10-CM

## 2022-12-01 DIAGNOSIS — Z3A01 Less than 8 weeks gestation of pregnancy: Secondary | ICD-10-CM

## 2022-12-01 DIAGNOSIS — R1013 Epigastric pain: Secondary | ICD-10-CM | POA: Diagnosis not present

## 2022-12-01 DIAGNOSIS — O3680X0 Pregnancy with inconclusive fetal viability, not applicable or unspecified: Secondary | ICD-10-CM

## 2022-12-01 DIAGNOSIS — Z3201 Encounter for pregnancy test, result positive: Secondary | ICD-10-CM | POA: Diagnosis not present

## 2022-12-01 DIAGNOSIS — Z3687 Encounter for antenatal screening for uncertain dates: Secondary | ICD-10-CM | POA: Diagnosis not present

## 2022-12-01 LAB — POCT URINE PREGNANCY: Preg Test, Ur: POSITIVE — AB

## 2022-12-01 NOTE — Progress Notes (Signed)
    GYNECOLOGY PROGRESS NOTE  Subjective:    Patient ID: Alexa Grant, female    DOB: Aug 23, 1995, 27 y.o.   MRN: [id number]  HPI  Patient is a 27 y.o. G1P0000 here for epigastric pain and early pregnancy. She has not had any vaginal bleeding. She was given heartburn meds and this has made the pain slightly better.  She had a work in [employer] ultrasound today that showed an IUD with FHR 110s.   She had a CMP done 11/14/2022 that showed elevated LFTs. She has an appt next month with her primary care provider to work up this. Amylase and lipase were normal at that time. She had a cholecystectomy in the past.   The following portions of the patient's history were reviewed and updated as appropriate: allergies, current medications, past family history, past medical history, past social history, past surgical history, and problem list.  Review of Systems Pertinent items are noted in HPI.  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  Objective:   Blood pressure (!) 108/59, pulse 94, height 5\' 5"  (1.651 m), weight 149 lb (67.6 kg), last menstrual period 10/07/2022. Body mass index is 24.79 kg/m. Well nourished, well hydrated White female, no apparent distress She is ambulating and conversing normally.    Assessment:   1. Pelvic pain in pregnancy   2. Pregnancy with uncertain fetal viability, single or unspecified fetus      Plan:   1. Pregnancy with uncertain fetal viability, single or unspecified fetus  - US PELVIC COMPLETE WITH TRANSVAGINAL - US OB LESS THAN 14 WEEKS WITH OB TRANSVAGINAL - POCT urine pregnancy  2. Pelvic pain in pregnancy  - US OB LESS THAN 14 WEEKS WITH OB TRANSVAGINAL - POCT urine pregnancy   She will have NOB blood drawn around [redacted] weeks EGA and NOB visit around [redacted] weeks EGA.

## 2022-12-05 ENCOUNTER — Other Ambulatory Visit: Payer: BC Managed Care – PPO

## 2022-12-10 ENCOUNTER — Ambulatory Visit (INDEPENDENT_AMBULATORY_CARE_PROVIDER_SITE_OTHER): Payer: BC Managed Care – PPO

## 2022-12-10 VITALS — Wt 149.0 lb

## 2022-12-10 DIAGNOSIS — O0990 Supervision of high risk pregnancy, unspecified, unspecified trimester: Secondary | ICD-10-CM

## 2022-12-10 DIAGNOSIS — Z3689 Encounter for other specified antenatal screening: Secondary | ICD-10-CM

## 2022-12-10 DIAGNOSIS — Z3480 Encounter for supervision of other normal pregnancy, unspecified trimester: Secondary | ICD-10-CM

## 2022-12-10 HISTORY — DX: Supervision of high risk pregnancy, unspecified, unspecified trimester: O09.90

## 2022-12-10 MED ORDER — ONDANSETRON 4 MG PO TBDP
4.0000 mg | ORAL_TABLET | Freq: Four times a day (QID) | ORAL | 2 refills | Status: DC | PRN
Start: 2022-12-10 — End: 2023-04-27

## 2022-12-10 NOTE — Patient Instructions (Signed)
First Trimester of Pregnancy  The first trimester of pregnancy starts on the first day of your last menstrual period until the end of week 12. This is also called months 1 through 3 of pregnancy. Body changes during your first trimester Your body goes through many changes during pregnancy. The changes usually return to normal after your baby is born. Physical changes You may gain or lose weight. Your breasts may grow larger and hurt. The area around your nipples may get darker. Dark spots or blotches may develop on your face. You may have changes in your hair. Health changes You may feel like you might vomit (nauseous), and you may vomit. You may have heartburn. You may have headaches. You may have trouble pooping (constipation). Your gums may bleed. Other changes You may get tired easily. You may pee (urinate) more often. Your menstrual periods will stop. You may not feel hungry. You may want to eat certain kinds of food. You may have changes in your emotions from day to day. You may have more dreams. Follow these instructions at home: Medicines Take over-the-counter and prescription medicines only as told by your doctor. Some medicines are not safe during pregnancy. Take a prenatal vitamin that contains at least 600 micrograms (mcg) of folic acid. Eating and drinking Eat healthy meals that include: Fresh fruits and vegetables. Whole grains. Good sources of protein, such as meat, eggs, or tofu. Low-fat dairy products. Avoid raw meat and unpasteurized juice, milk, and cheese. If you feel like you may vomit, or you vomit: Eat 4 or 5 small meals a day instead of 3 large meals. Try eating a few soda crackers. Drink liquids between meals instead of during meals. You may need to take these actions to prevent or treat trouble pooping: Drink enough fluids to keep your pee (urine) pale yellow. Eat foods that are high in fiber. These include beans, whole grains, and fresh fruits and  vegetables. Limit foods that are high in fat and sugar. These include fried or sweet foods. Activity Exercise only as told by your doctor. Most people can do their usual exercise routine during pregnancy. Stop exercising if you have cramps or pain in your lower belly (abdomen) or low back. Do not exercise if it is too hot or too humid, or if you are in a place of great height (high altitude). Avoid heavy lifting. If you choose to, you may have sex unless your doctor tells you not to. Relieving pain and discomfort Wear a good support bra if your breasts are sore. Rest with your legs raised (elevated) if you have leg cramps or low back pain. If you have bulging veins (varicose veins) in your legs: Wear support hose as told by your doctor. Raise your feet for 15 minutes, 3-4 times a day. Limit salt in your food. Safety Wear your seat belt at all times when you are in a car. Talk with your doctor if someone is hurting you or yelling at you. Talk with your doctor if you are feeling sad or have thoughts of hurting yourself. Lifestyle Do not use hot tubs, steam rooms, or saunas. Do not douche. Do not use tampons or scented sanitary pads. Do not use herbal medicines, illegal drugs, or medicines that are not approved by your doctor. Do not drink alcohol. Do not smoke or use any products that contain nicotine or tobacco. If you need help quitting, ask your doctor. Avoid cat litter boxes and soil that is used by cats. These carry   germs that can cause harm to the baby and can cause a loss of your baby by miscarriage or stillbirth. General instructions Keep all follow-up visits. This is important. Ask for help if you need counseling or if you need help with nutrition. Your doctor can give you advice or tell you where to go for help. Visit your dentist. At home, brush your teeth with a soft toothbrush. Floss gently. Write down your questions. Take them to your prenatal visits. Where to find more  information American Pregnancy Association: americanpregnancy.org American College of Obstetricians and Gynecologists: www.acog.org Office on Women's Health: womenshealth.gov/pregnancy Contact a doctor if: You are dizzy. You have a fever. You have mild cramps or pressure in your lower belly. You have a nagging pain in your belly area. You continue to feel like you may vomit, you vomit, or you have watery poop (diarrhea) for 24 hours or longer. You have a bad-smelling fluid coming from your vagina. You have pain when you pee. You are exposed to a disease that spreads from person to person, such as chickenpox, measles, Zika virus, HIV, or hepatitis. Get help right away if: You have spotting or bleeding from your vagina. You have very bad belly cramping or pain. You have shortness of breath or chest pain. You have any kind of injury, such as from a fall or a car crash. You have new or increased pain, swelling, or redness in an arm or leg. Summary The first trimester of pregnancy starts on the first day of your last menstrual period until the end of week 12 (months 1 through 3). Eat 4 or 5 small meals a day instead of 3 large meals. Do not smoke or use any products that contain nicotine or tobacco. If you need help quitting, ask your doctor. Keep all follow-up visits. This information is not intended to replace advice given to you by your health care provider. Make sure you discuss any questions you have with your health care provider. Document Revised: 09/07/2019 Document Reviewed: 07/14/2019 Elsevier Patient Education  2024 Elsevier Inc. Commonly Asked Questions During Pregnancy  Cats: A parasite can be excreted in cat feces.  To avoid exposure you need to have another person empty the little box.  If you must empty the litter box you will need to wear gloves.  Wash your hands after handling your cat.  This parasite can also be found in raw or undercooked meat so this should also be  avoided.  Colds, Sore Throats, Flu: Please check your medication sheet to see what you can take for symptoms.  If your symptoms are unrelieved by these medications please call the office.  Dental Work: Most any dental work your dentist recommends is permitted.  X-rays should only be taken during the first trimester if absolutely necessary.  Your abdomen should be shielded with a lead apron during all x-rays.  Please notify your provider prior to receiving any x-rays.  Novocaine is fine; gas is not recommended.  If your dentist requires a note from us prior to dental work please call the office and we will provide one for you.  Exercise: Exercise is an important part of staying healthy during your pregnancy.  You may continue most exercises you were accustomed to prior to pregnancy.  Later in your pregnancy you will most likely notice you have difficulty with activities requiring balance like riding a bicycle.  It is important that you listen to your body and avoid activities that put you at a higher   risk of falling.  Adequate rest and staying well hydrated are a must!  If you have questions about the safety of specific activities ask your provider.    Exposure to Children with illness: Try to avoid obvious exposure; report any symptoms to us when noted,  If you have chicken pos, red measles or mumps, you should be immune to these diseases.   Please do not take any vaccines while pregnant unless you have checked with your OB provider.  Fetal Movement: After 28 weeks we recommend you do "kick counts" twice daily.  Lie or sit down in a calm quiet environment and count your baby movements "kicks".  You should feel your baby at least 10 times per hour.  If you have not felt 10 kicks within the first hour get up, walk around and have something sweet to eat or drink then repeat for an additional hour.  If count remains less than 10 per hour notify your provider.  Fumigating: Follow your pest control agent's  advice as to how long to stay out of your home.  Ventilate the area well before re-entering.  Hemorrhoids:   Most over-the-counter preparations can be used during pregnancy.  Check your medication to see what is safe to use.  It is important to use a stool softener or fiber in your diet and to drink lots of liquids.  If hemorrhoids seem to be getting worse please call the office.   Hot Tubs:  Hot tubs Jacuzzis and saunas are not recommended while pregnant.  These increase your internal body temperature and should be avoided.  Intercourse:  Sexual intercourse is safe during pregnancy as long as you are comfortable, unless otherwise advised by your provider.  Spotting may occur after intercourse; report any bright red bleeding that is heavier than spotting.  Labor:  If you know that you are in labor, please go to the hospital.  If you are unsure, please call the office and let us help you decide what to do.  Lifting, straining, etc:  If your job requires heavy lifting or straining please check with your provider for any limitations.  Generally, you should not lift items heavier than that you can lift simply with your hands and arms (no back muscles)  Painting:  Paint fumes do not harm your pregnancy, but may make you ill and should be avoided if possible.  Latex or water based paints have less odor than oils.  Use adequate ventilation while painting.  Permanents & Hair Color:  Chemicals in hair dyes are not recommended as they cause increase hair dryness which can increase hair loss during pregnancy.  " Highlighting" and permanents are allowed.  Dye may be absorbed differently and permanents may not hold as well during pregnancy.  Sunbathing:  Use a sunscreen, as skin burns easily during pregnancy.  Drink plenty of fluids; avoid over heating.  Tanning Beds:  Because their possible side effects are still unknown, tanning beds are not recommended.  Ultrasound Scans:  Routine ultrasounds are performed  at approximately 20 weeks.  You will be able to see your baby's general anatomy an if you would like to know the gender this can usually be determined as well.  If it is questionable when you conceived you may also receive an ultrasound early in your pregnancy for dating purposes.  Otherwise ultrasound exams are not routinely performed unless there is a medical necessity.  Although you can request a scan we ask that you pay for it when   conducted because insurance does not cover " patient request" scans.  Work: If your pregnancy proceeds without complications you may work until your due date, unless your physician or employer advises otherwise.  Round Ligament Pain/Pelvic Discomfort:  Sharp, shooting pains not associated with bleeding are fairly common, usually occurring in the second trimester of pregnancy.  They tend to be worse when standing up or when you remain standing for long periods of time.  These are the result of pressure of certain pelvic ligaments called "round ligaments".  Rest, Tylenol and heat seem to be the most effective relief.  As the womb and fetus grow, they rise out of the pelvis and the discomfort improves.  Please notify the office if your pain seems different than that described.  It may represent a more serious condition.  Common Medications Safe in Pregnancy  Acne:      Constipation:  Benzoyl Peroxide     Colace  Clindamycin      Dulcolax Suppository  Topica Erythromycin     Fibercon  Salicylic Acid      Metamucil         Miralax AVOID:        Senakot   Accutane    Cough:  Retin-A       Cough Drops  Tetracycline      Phenergan w/ Codeine if Rx  Minocycline      Robitussin (Plain & DM)  Antibiotics:     Crabs/Lice:  Ceclor       RID  Cephalosporins    AVOID:  E-Mycins      Kwell  Keflex  Macrobid/Macrodantin   Diarrhea:  Penicillin      Kao-Pectate  Zithromax      Imodium AD         PUSH FLUIDS AVOID:       Cipro     Fever:  Tetracycline      Tylenol (Regular  or Extra  Minocycline       Strength)  Levaquin      Extra Strength-Do not          Exceed 8 tabs/24 hrs Caffeine:        <200mg/day (equiv. To 1 cup of coffee or  approx. 3 12 oz sodas)         Gas: Cold/Hayfever:       Gas-X  Benadryl      Mylicon  Claritin       Phazyme  **Claritin-D        Chlor-Trimeton    Headaches:  Dimetapp      ASA-Free Excedrin  Drixoral-Non-Drowsy     Cold Compress  Mucinex (Guaifenasin)     Tylenol (Regular or Extra  Sudafed/Sudafed-12 Hour     Strength)  **Sudafed PE Pseudoephedrine   Tylenol Cold & Sinus     Vicks Vapor Rub  Zyrtec  **AVOID if Problems With Blood Pressure         Heartburn: Avoid lying down for at least 1 hour after meals  Aciphex      Maalox     Rash:  Milk of Magnesia     Benadryl    Mylanta       1% Hydrocortisone Cream  Pepcid  Pepcid Complete   Sleep Aids:  Prevacid      Ambien   Prilosec       Benadryl  Rolaids       Chamomile Tea  Tums (Limit 4/day)     Unisom           Tylenol PM         Warm milk-add vanilla or  Hemorrhoids:       Sugar for taste  Anusol/Anusol H.C.  (RX: Analapram 2.5%)  Sugar Substitutes:  Hydrocortisone OTC     Ok in moderation  Preparation H      Tucks        Vaseline lotion applied to tissue with wiping    Herpes:     Throat:  Acyclovir      Oragel  Famvir  Valtrex     Vaccines:         Flu Shot Leg Cramps:       *Gardasil  Benadryl      Hepatitis A         Hepatitis B Nasal Spray:       Pneumovax  Saline Nasal Spray     Polio Booster         Tetanus Nausea:       Tuberculosis test or PPD  Vitamin B6 25 mg TID   AVOID:    Dramamine      *Gardasil  Emetrol       Live Poliovirus  Ginger Root 250 mg QID    MMR (measles, mumps &  High Complex Carbs @ Bedtime    rebella)  Sea Bands-Accupressure    Varicella (Chickenpox)  Unisom 1/2 tab TID     *No known complications           If received before Pain:         Known pregnancy;   Darvocet       Resume series  after  Lortab        Delivery  Percocet    Yeast:   Tramadol      Femstat  Tylenol 3      Gyne-lotrimin  Ultram       Monistat  Vicodin           MISC:         All Sunscreens           Hair Coloring/highlights          Insect Repellant's          (Including DEET)         Mystic Tans  

## 2022-12-10 NOTE — Progress Notes (Signed)
New OB Intake  I connected with  Alexa Grant on 12/10/22 at  8:15 AM EDT by telephone Video Visit and verified that I am speaking with the correct person using two identifiers. Nurse is located at [facility] and pt is located at home.  I explained I am completing New OB Intake today. We discussed her EDD of 07/26/2023 that is based on u/s of [redacted]w[redacted]d. Pt is G1/P0. I reviewed her allergies, medications, Medical/Surgical/OB history, and appropriate screenings. There are no cats in the home.  Based on history, this is a/an pregnancy uncomplicated .   Patient Active Problem List   Diagnosis Date Noted   Supervision of other normal pregnancy, antepartum 12/10/2022   Anxiety and depression 05/13/2019   Biliary dyskinesia 01/25/2016    Concerns addressed today Pt interested in water birth; adv I thought we did them if the pt provided their own tub but recently heard we do not do them at all.  Pt needs refill of zofran which I will send in.  Delivery Plans:  Plans to deliver at [facility].  Anatomy US Explained first scheduled [place] was done 8/19th and an anatomy scan will be done at 20 weeks.  Labs Discussed genetic screening with patient. Patient desires genetic testing to be drawn at new OB visit. Discussed possible labs to be drawn at new OB appointment.  COVID Vaccine Patient has had COVID vaccine.   Social Determinants of Health Food Insecurity: denies food insecurity  Transportation: Patient denies transportation needs.  First visit review I reviewed new OB appt with pt. I explained she will have ob bloodwork and pap smear/pelvic exam if indicated. Explained pt will be seen by Dr. Nicholaus Bloom at first visit; encounter routed to appropriate provider.   Ashante Rodriguez, [facility] 12/10/2022  8:46 AM

## 2022-12-11 ENCOUNTER — Ambulatory Visit: Payer: No Typology Code available for payment source | Admitting: Obstetrics and Gynecology

## 2022-12-29 ENCOUNTER — Other Ambulatory Visit: Payer: BC Managed Care – PPO

## 2022-12-29 ENCOUNTER — Other Ambulatory Visit (HOSPITAL_COMMUNITY): Payer: Self-pay | Admitting: Obstetrics & Gynecology

## 2022-12-29 DIAGNOSIS — Z3480 Encounter for supervision of other normal pregnancy, unspecified trimester: Secondary | ICD-10-CM

## 2022-12-29 NOTE — Addendum Note (Signed)
Addended by: Cornelius Moras D on: 12/29/2022 08:50 AM   Modules accepted: Orders

## 2022-12-30 LAB — CBC/D/PLT+RPR+RH+ABO+RUBIGG...
Antibody Screen: NEGATIVE
Basophils Absolute: 0 10*3/uL (ref 0.0–0.2)
Basos: 0 %
EOS (ABSOLUTE): 0.2 10*3/uL (ref 0.0–0.4)
Eos: 4 %
HCV Ab: NONREACTIVE
HIV Screen 4th Generation wRfx: NONREACTIVE
Hematocrit: 41.1 % (ref 34.0–46.6)
Hemoglobin: 13.2 g/dL (ref 11.1–15.9)
Hepatitis B Surface Ag: NEGATIVE
Immature Grans (Abs): 0 10*3/uL (ref 0.0–0.1)
Immature Granulocytes: 0 %
Lymphocytes Absolute: 1.5 10*3/uL (ref 0.7–3.1)
Lymphs: 25 %
MCH: 30.5 pg (ref 26.6–33.0)
MCHC: 32.1 g/dL (ref 31.5–35.7)
MCV: 95 fL (ref 79–97)
Monocytes Absolute: 0.5 10*3/uL (ref 0.1–0.9)
Monocytes: 8 %
Neutrophils Absolute: 3.6 10*3/uL (ref 1.4–7.0)
Neutrophils: 63 %
Platelets: 191 10*3/uL (ref 150–450)
RBC: 4.33 x10E6/uL (ref 3.77–5.28)
RDW: 12.8 % (ref 11.7–15.4)
RPR Ser Ql: NONREACTIVE
Rh Factor: POSITIVE
Rubella Antibodies, IGG: <0.9 index — ABNORMAL LOW (ref >0.99)
Varicella zoster IgG: 366 index (ref >165)
WBC: 5.8 10*3/uL (ref 3.4–10.8)

## 2022-12-30 LAB — HCV INTERPRETATION

## 2022-12-31 ENCOUNTER — Other Ambulatory Visit: Payer: Self-pay | Admitting: Internal Medicine

## 2022-12-31 DIAGNOSIS — R1011 Right upper quadrant pain: Secondary | ICD-10-CM

## 2022-12-31 DIAGNOSIS — Q796 Ehlers-Danlos syndrome, unspecified: Secondary | ICD-10-CM | POA: Insufficient documentation

## 2022-12-31 LAB — URINE CULTURE, OB REFLEX

## 2022-12-31 LAB — CULTURE, OB URINE

## 2023-01-01 ENCOUNTER — Ambulatory Visit
Admission: RE | Admit: 2023-01-01 | Discharge: 2023-01-01 | Disposition: A | Discharge status: Home / Self Care | Payer: BC Managed Care – PPO | Source: Ambulatory Visit | Attending: Internal Medicine | Admitting: Internal Medicine

## 2023-01-01 DIAGNOSIS — R1011 Right upper quadrant pain: Secondary | ICD-10-CM | POA: Diagnosis present

## 2023-01-04 LAB — MATERNIT 21 PLUS CORE, BLOOD
Fetal Fraction: 22
Result (T21): NEGATIVE
Trisomy 13 (Patau syndrome): NEGATIVE
Trisomy 18 (Edwards syndrome): NEGATIVE
Trisomy 21 (Down syndrome): NEGATIVE

## 2023-01-05 ENCOUNTER — Other Ambulatory Visit (HOSPITAL_COMMUNITY)
Admission: RE | Admit: 2023-01-05 | Discharge: 2023-01-05 | Disposition: A | Discharge status: Home / Self Care | Payer: BC Managed Care – PPO | Source: Ambulatory Visit | Attending: Obstetrics & Gynecology | Admitting: Obstetrics & Gynecology

## 2023-01-05 ENCOUNTER — Ambulatory Visit (INDEPENDENT_AMBULATORY_CARE_PROVIDER_SITE_OTHER): Payer: BC Managed Care – PPO | Admitting: Obstetrics & Gynecology

## 2023-01-05 ENCOUNTER — Other Ambulatory Visit: Payer: Self-pay | Admitting: Obstetrics and Gynecology

## 2023-01-05 VITALS — BP 101/67 | HR 93 | Wt 149.0 lb

## 2023-01-05 DIAGNOSIS — Z3491 Encounter for supervision of normal pregnancy, unspecified, first trimester: Secondary | ICD-10-CM | POA: Insufficient documentation

## 2023-01-05 DIAGNOSIS — Z3A11 11 weeks gestation of pregnancy: Secondary | ICD-10-CM | POA: Diagnosis not present

## 2023-01-05 DIAGNOSIS — Z3481 Encounter for supervision of other normal pregnancy, first trimester: Secondary | ICD-10-CM | POA: Diagnosis not present

## 2023-01-05 DIAGNOSIS — Z124 Encounter for screening for malignant neoplasm of cervix: Secondary | ICD-10-CM | POA: Insufficient documentation

## 2023-01-05 DIAGNOSIS — N898 Other specified noninflammatory disorders of vagina: Secondary | ICD-10-CM

## 2023-01-05 DIAGNOSIS — F32A Depression, unspecified: Secondary | ICD-10-CM

## 2023-01-05 DIAGNOSIS — R7989 Other specified abnormal findings of blood chemistry: Secondary | ICD-10-CM

## 2023-01-05 DIAGNOSIS — R300 Dysuria: Secondary | ICD-10-CM

## 2023-01-05 DIAGNOSIS — Z3480 Encounter for supervision of other normal pregnancy, unspecified trimester: Secondary | ICD-10-CM

## 2023-01-05 LAB — POCT URINALYSIS DIPSTICK OB
Bilirubin, UA: NEGATIVE
Blood, UA: NEGATIVE
Glucose, UA: NEGATIVE
Ketones, UA: NEGATIVE
Nitrite, UA: NEGATIVE
POC,PROTEIN,UA: NEGATIVE
Spec Grav, UA: 1.01 (ref 1.010–1.025)
Urobilinogen, UA: 1 E.U./dL
pH, UA: 6.5 (ref 5.0–8.0)

## 2023-01-05 MED ORDER — SERTRALINE HCL 100 MG PO TABS
100.0000 mg | ORAL_TABLET | Freq: Every day | ORAL | 0 refills | Status: DC
Start: 2023-01-05 — End: 2023-04-01

## 2023-01-05 NOTE — Progress Notes (Addendum)
Subjective:    Alexa Grant is a 27 yo married G1P0000 at [redacted]w[redacted]d by 6 week ultrasound being seen today for her first obstetrical visit.  Her obstetrical history is significant for  abnormal TSH last week . Patient does intend to breast feed. Pregnancy history fully reviewed.  Patient reports some dysuria and vaginal itching.  Vitals:   01/05/23 0846  BP: 101/67  Pulse: 93  Weight: 149 lb (67.6 kg)    HISTORY: OB History  Gravida Para Term Preterm AB Living  1 0 0 0 0 0  SAB IAB Ectopic Multiple Live Births  0 0 0 0 0    # Outcome Date GA Lbr Len/2nd Weight Sex Type Anes PTL Lv  1 Current            Past Medical History:  Diagnosis Date   Biliary dyskinesia 01/25/2016   Family history of adverse reaction to anesthesia    dad gets sick with anesthesia   Gastric ulcer    GERD (gastroesophageal reflux disease)    takes Pantoprazole daily   Headache    History of migraine    few yrs ago   Joint pain    PONV (postoperative nausea and vomiting)    Seasonal allergies    Tylenol Sinus and Nasonex as needed   Vaccine for human papilloma virus (HPV) types 6, 11, 16, and 18 administered    Past Surgical History:  Procedure Laterality Date   CHOLECYSTECTOMY N/A 01/25/2016   Procedure: LAPAROSCOPIC CHOLECYSTECTOMY WITH INTRAOPERATIVE CHOLANGIOGRAM;  Surgeon: Claud Kelp, MD;  Location: [facility] OR;  Service: General;  Laterality: N/A;   COLONOSCOPY WITH ESOPHAGOGASTRODUODENOSCOPY (EGD)     HIP SURGERY Bilateral    labrum repair   HIP SURGERY Right 2023   remove hardware   SHOULDER SURGERY Bilateral    labrum repair   Family History  Problem Relation Age of Onset   Healthy Mother    Healthy Father    Healthy Sister    Healthy Maternal Grandmother    Healthy Maternal Grandfather    Liver disease Paternal Grandmother    Diabetes Paternal Grandfather    Congenital heart disease Paternal Grandfather    Liver disease Paternal Grandfather    Breast cancer Paternal Aunt         not sure of age   She is an occupational therapist.  Exam    Uterus:     Pelvic Exam:    Perineum: No Hemorrhoids   Vulva: normal   Vagina:  White thick vaginal discharge       Cervix: anteverted   Adnexa: normal adnexa   Bony Pelvis: android  System: Breast:  normal appearance, no masses or tenderness   Skin: normal coloration and turgor, no rashes    Neurologic: oriented   Extremities: normal strength, tone, and muscle mass   HEENT PERRLA   Mouth/Teeth mucous membranes moist, pharynx normal without lesions   Neck supple   Cardiovascular: regular rate and rhythm   Respiratory:  appears well, vitals normal, no respiratory distress, acyanotic, normal RR, ear and throat exam is normal, neck free of mass or lymphadenopathy, chest clear, no wheezing, crepitations, rhonchi, normal symmetric air entry   Abdomen: soft, non-tender; bowel sounds normal; no masses,  no organomegaly   Urinary: urethral meatus normal      Assessment:    Pregnancy: G1P0000 Patient Active Problem List   Diagnosis Date Noted   Supervision of other normal pregnancy, antepartum 12/10/2022   Anxiety and depression  05/13/2019   Biliary dyskinesia 01/25/2016        Plan:     Initial labs drawn. Prenatal vitamins. Problem list reviewed and updated. Mat 21 low risk.  Ultrasound discussed; fetal survey: ordered for anatomy at 19 weeks  Follow up in 4 weeks. Pap smear, urine culture and free T4 done today Vaginal itching - Aptima sent  Alexa Grant 01/05/2023

## 2023-01-05 NOTE — Addendum Note (Signed)
Addended by: [name] on: 01/05/2023 03:35 PM   Modules accepted: Orders

## 2023-01-05 NOTE — Addendum Note (Signed)
Addended by: Cornelius Moras D on: 01/05/2023 09:28 AM   Modules accepted: Orders

## 2023-01-05 NOTE — Telephone Encounter (Signed)
Sending to you since  you just did NOB today. Thank you.

## 2023-01-06 ENCOUNTER — Encounter: Payer: Self-pay | Admitting: Obstetrics & Gynecology

## 2023-01-06 LAB — CERVICOVAGINAL ANCILLARY ONLY
Bacterial Vaginitis (gardnerella): NEGATIVE
Candida Glabrata: NEGATIVE
Candida Vaginitis: POSITIVE — AB
Comment: NEGATIVE
Comment: NEGATIVE
Comment: NEGATIVE
Comment: NEGATIVE
Trichomonas: NEGATIVE

## 2023-01-06 LAB — T4, FREE: Free T4: 1.17 ng/dL (ref 0.82–1.77)

## 2023-01-07 LAB — CYTOLOGY - PAP
Chlamydia: NEGATIVE
Comment: NEGATIVE
Comment: NORMAL
Diagnosis: NEGATIVE
Neisseria Gonorrhea: NEGATIVE

## 2023-01-07 LAB — URINE CULTURE

## 2023-01-13 ENCOUNTER — Encounter: Payer: Self-pay | Admitting: Obstetrics & Gynecology

## 2023-02-02 ENCOUNTER — Other Ambulatory Visit (HOSPITAL_COMMUNITY)
Admission: RE | Admit: 2023-02-02 | Discharge: 2023-02-02 | Disposition: A | Discharge status: Home / Self Care | Payer: BC Managed Care – PPO | Source: Ambulatory Visit | Attending: Obstetrics | Admitting: Obstetrics

## 2023-02-02 ENCOUNTER — Ambulatory Visit (INDEPENDENT_AMBULATORY_CARE_PROVIDER_SITE_OTHER): Payer: BC Managed Care – PPO | Admitting: Obstetrics

## 2023-02-02 ENCOUNTER — Encounter: Payer: Self-pay | Admitting: Obstetrics

## 2023-02-02 VITALS — BP 105/68 | HR 93 | Wt 152.0 lb

## 2023-02-02 DIAGNOSIS — N898 Other specified noninflammatory disorders of vagina: Secondary | ICD-10-CM | POA: Diagnosis present

## 2023-02-02 DIAGNOSIS — Q796 Ehlers-Danlos syndrome, unspecified: Secondary | ICD-10-CM | POA: Insufficient documentation

## 2023-02-02 DIAGNOSIS — Z3689 Encounter for other specified antenatal screening: Secondary | ICD-10-CM

## 2023-02-02 DIAGNOSIS — Z3480 Encounter for supervision of other normal pregnancy, unspecified trimester: Secondary | ICD-10-CM

## 2023-02-02 DIAGNOSIS — Z3A15 15 weeks gestation of pregnancy: Secondary | ICD-10-CM

## 2023-02-02 NOTE — Progress Notes (Signed)
    Return Prenatal Note   Assessment/Plan   Plan  27 y.o. G1P0000 at [redacted]w[redacted]d presents for follow-up OB visit. Reviewed prenatal record including previous visit note.  Supervision of other normal pregnancy, antepartum -Anticipatory guidance about the second trimester -Anatomy US ordered -Encouraged physical activity, adequate rest -Planning CBE    Orders Placed This Encounter  Procedures   US OB Comp + 14 Wk    Standing Status:   Future    Standing Expiration Date:   02/02/2024    Order Specific Question:   Reason for Exam (SYMPTOM  OR DIAGNOSIS REQUIRED)    Answer:   anatomy    Order Specific Question:   Preferred Imaging Location?    Answer:   Internal   Return in about 4 weeks (around 03/02/2023).   No future appointments.  For next visit:  continue with routine prenatal care     Subjective   Shania's nausea is much improved. Her yeast symptoms have cleared, but she is now having increased discharge. She is feeling occasional mild cramps. Excited to hear FHT today!  Movement: Absent Contractions: Not present  Objective   Flow sheet Vitals: Pulse Rate: 93 BP: 105/68 Fetal Heart Rate (bpm): 142 Total weight gain: 7 lb (3.175 kg)  General Appearance  No acute distress, well appearing, and well nourished Pulmonary   Normal work of breathing Neurologic   Alert and oriented to person, place, and time Psychiatric   Mood and affect within normal limits  Guadlupe Spanish, CNM 02/02/23 8:39 AM

## 2023-02-02 NOTE — Assessment & Plan Note (Addendum)
-  Anticipatory guidance about the second trimester -Anatomy US ordered -Encouraged physical activity, adequate rest -Planning CBE -Swab collected

## 2023-02-03 LAB — CERVICOVAGINAL ANCILLARY ONLY
Bacterial Vaginitis (gardnerella): NEGATIVE
Candida Glabrata: NEGATIVE
Candida Vaginitis: POSITIVE — AB
Comment: NEGATIVE
Comment: NEGATIVE
Comment: NEGATIVE

## 2023-02-04 ENCOUNTER — Encounter: Payer: Self-pay | Admitting: Obstetrics

## 2023-02-04 ENCOUNTER — Other Ambulatory Visit: Payer: Self-pay | Admitting: Obstetrics

## 2023-02-04 MED ORDER — TERCONAZOLE 0.4 % VA CREA: 1.0000 | TOPICAL_CREAM | Freq: Every day | VAGINAL | 0 refills | Status: DC

## 2023-03-02 ENCOUNTER — Ambulatory Visit (INDEPENDENT_AMBULATORY_CARE_PROVIDER_SITE_OTHER): Payer: BC Managed Care – PPO

## 2023-03-02 ENCOUNTER — Ambulatory Visit: Payer: BC Managed Care – PPO

## 2023-03-02 VITALS — BP 99/65 | HR 85 | Wt 155.0 lb

## 2023-03-02 DIAGNOSIS — Z3A19 19 weeks gestation of pregnancy: Secondary | ICD-10-CM

## 2023-03-02 DIAGNOSIS — Z3689 Encounter for other specified antenatal screening: Secondary | ICD-10-CM

## 2023-03-02 DIAGNOSIS — O99891 Other specified diseases and conditions complicating pregnancy: Secondary | ICD-10-CM

## 2023-03-02 DIAGNOSIS — O09899 Supervision of other high risk pregnancies, unspecified trimester: Secondary | ICD-10-CM | POA: Insufficient documentation

## 2023-03-02 DIAGNOSIS — O4440 Low lying placenta NOS or without hemorrhage, unspecified trimester: Secondary | ICD-10-CM | POA: Insufficient documentation

## 2023-03-02 DIAGNOSIS — Q796 Ehlers-Danlos syndrome, unspecified: Secondary | ICD-10-CM

## 2023-03-02 DIAGNOSIS — Z3480 Encounter for supervision of other normal pregnancy, unspecified trimester: Secondary | ICD-10-CM

## 2023-03-02 NOTE — Addendum Note (Signed)
Addended by: Autumn Messing on: 03/02/2023 10:45 AM   Modules accepted: Orders

## 2023-03-02 NOTE — Assessment & Plan Note (Addendum)
-   [place] today in clinic. - Flu vaccine offered today. Patient states she has already received it. - Reviewed red flag warning signs anticipatory guidance for upcoming prenatal care.

## 2023-03-02 NOTE — Assessment & Plan Note (Signed)
-   Has had two hip surgeries with epidurals. Did not like the recovery post-epidural and hopes to avoid epidural in birth. Completed L&D CBE through [facility]. Would like more resources for other options for non-medicated birth. Discussed hypnobirthing-will look into.

## 2023-03-02 NOTE — Progress Notes (Signed)
    Return Prenatal Note   Assessment/Plan   Plan  27 y.o. G1P0000 at [redacted]w[redacted]d presents for follow-up OB visit. Reviewed prenatal record including previous visit note.  Supervision of other normal pregnancy, antepartum - [place] today in clinic. - Flu vaccine offered today. Patient states she has already received it. - Reviewed red flag warning signs anticipatory guidance for upcoming prenatal care.   Ehlers-Danlos syndrome - Has had two hip surgeries with epidurals. Did not like the recovery post-epidural and hopes to avoid epidural in birth. Completed L&D CBE through [facility]. Would like more resources for other options for non-medicated birth. Discussed hypnobirthing-will look into.    No orders of the defined types were placed in this encounter.  Return in about 4 weeks (around 03/30/2023) for ROB.   Future Appointments  Date Time Provider Department Center  03/02/2023  9:35 AM Fenix Ruppe, Lindalou Hose, CNM [facility] None  03/30/2023  8:15 AM Mirna Mires, CNM [facility] None    For next visit:  continue with routine prenatal care     Subjective   27 y.o. G1P0000 at [redacted]w[redacted]d presents for this follow-up prenatal visit.  Patient has no concerns. Patient reports: Movement: Absent Contractions: Not present  Objective   Flow sheet Vitals: Pulse Rate: 85 BP: 99/65 Fundal Height: 19 cm Fetal Heart Rate (bpm): 145 Total weight gain: 10 lb (4.536 kg)  General Appearance  No acute distress, well appearing, and well nourished Pulmonary   Normal work of breathing Neurologic   Alert and oriented to person, place, and time Psychiatric   Mood and affect within normal limits  Lindalou Hose Hansford Hirt, CNM  11/18/249:05 AM

## 2023-03-09 ENCOUNTER — Other Ambulatory Visit: Payer: Self-pay | Admitting: Obstetrics

## 2023-03-30 ENCOUNTER — Ambulatory Visit (INDEPENDENT_AMBULATORY_CARE_PROVIDER_SITE_OTHER): Payer: BC Managed Care – PPO | Admitting: Obstetrics

## 2023-03-30 ENCOUNTER — Encounter: Payer: BC Managed Care – PPO | Admitting: Obstetrics

## 2023-03-30 ENCOUNTER — Encounter: Payer: Self-pay | Admitting: Obstetrics

## 2023-03-30 ENCOUNTER — Ambulatory Visit: Payer: BC Managed Care – PPO

## 2023-03-30 VITALS — BP 107/64 | HR 76 | Wt 160.2 lb

## 2023-03-30 DIAGNOSIS — B3731 Acute candidiasis of vulva and vagina: Secondary | ICD-10-CM

## 2023-03-30 DIAGNOSIS — Z3A22 22 weeks gestation of pregnancy: Secondary | ICD-10-CM | POA: Diagnosis not present

## 2023-03-30 DIAGNOSIS — Z3480 Encounter for supervision of other normal pregnancy, unspecified trimester: Secondary | ICD-10-CM

## 2023-03-30 DIAGNOSIS — O4440 Low lying placenta NOS or without hemorrhage, unspecified trimester: Secondary | ICD-10-CM

## 2023-03-30 DIAGNOSIS — O4442 Low lying placenta NOS or without hemorrhage, second trimester: Secondary | ICD-10-CM | POA: Diagnosis not present

## 2023-03-30 DIAGNOSIS — Z3A23 23 weeks gestation of pregnancy: Secondary | ICD-10-CM

## 2023-03-30 NOTE — Progress Notes (Signed)
Routine Prenatal Care Visit  Subjective  Alexa Grant is a 27 y.o. G1P0000 at [redacted]w[redacted]d being seen today for ongoing prenatal care.  She is currently monitored for the following issues for this low-risk pregnancy and has Biliary dyskinesia; Anxiety and depression; Supervision of other normal pregnancy, antepartum; Ehlers-Danlos syndrome; Rubella non-immune status, antepartum; and Low-lying placenta on their problem list.  ----------------------------------------------------------------------------------- Patient reports no complaints.  Feeling fetal movement. Many questions today. She would love to homebirth, but her hubby prefers a birth center or AOB.she hasalso developed some sxs of yeast vaginitis and would like a prescription. Contractions: Not present. Vag. Bleeding: None.  Movement: Present. Leaking Fluid denies.  ----------------------------------------------------------------------------------- The following portions of the patient's history were reviewed and updated as appropriate: allergies, current medications, past family history, past medical history, past social history, past surgical history and problem list. Problem list updated.  Objective  Blood pressure 107/64, pulse 76, weight 160 lb 3.2 oz (72.7 kg), last menstrual period 10/07/2022. Pregravid weight 145 lb (65.8 kg) Total Weight Gain 15 lb 3.2 oz (6.895 kg) Urinalysis: Urine Protein    Urine Glucose    Fetal Status:     Movement: Present     General:  Alert, oriented and cooperative. Patient is in no acute distress.  Skin: Skin is warm and dry. No rash noted.   Cardiovascular: Normal heart rate noted  Respiratory: Normal respiratory effort, no problems with respiration noted  Abdomen: Soft, gravid, appropriate for gestational age. Pain/Pressure: Absent     Pelvic:  Cervical exam deferred        Extremities: Normal range of motion.  Edema: None  Mental Status: Normal mood and affect. Normal behavior. Normal judgment  and thought content.   Assessment   27 y.o. G1P0000 at [redacted]w[redacted]d by  07/26/2023, by Ultrasound presenting for routine prenatal visit  Plan   first Problems (from 12/10/22 to present)     Problem Noted Diagnosed Resolved   Rubella non-immune status, antepartum 03/02/2023 by Free, Lindalou Hose, CNM  No   Low-lying placenta 03/02/2023 by Burney Gauze, CNM  No   Supervision of other normal pregnancy, antepartum 12/10/2022 by Loran Senters, CMA  No   Overview Addendum 03/02/2023  9:03 AM by Burney Gauze, CNM   Clinical Staff Provider  Office Location  [facility] Dating  8/19 [redacted]w[redacted]d  Language  English Anatomy US    Flu Vaccine  UTD Genetic Screen  NIPS: low risk, XX  TDaP vaccine   offer Hgb A1C or  GTT Early : Third trimester :   Covid One booster   LAB RESULTS   Rhogam  A/Positive/-- (09/16 8295)  Blood Type A/Positive/-- (09/16 0849)   Feeding Plan breast Antibody Negative (09/16 0849)  Contraception pill Rubella <0.90 (09/16 0849)  Circumcision yes RPR Non Reactive (09/16 0849)   Pediatrician  [facility] HBsAg Negative (09/16 0849)   Support Person Kennedy Bucker. Mom HIV Non Reactive (09/16 0849)  Prenatal Classes yes Varicella 366 (09/16 0849)    GBS  (For PCN allergy, check sensitivities)   BTL Consent  Hep C Non Reactive (09/16 0849)   VBAC Consent  Pap Diagnosis  Date Value Ref Range Status  01/05/2023   Final   - Negative for intraepithelial lesion or malignancy (NILM)      Hgb Electro      CF      SMA  Preterm labor symptoms and general obstetric precautions including but not limited to vaginal bleeding, contractions, leaking of fluid and fetal movement were reviewed in detail with the patient. Please refer to After Visit Summary for other counseling recommendations.  Reviewed her ultrasound. Normal anatomy and no longer low lying placenta. We talked about prep fro labor and birth. Doula program described. She would love to water birth- continue this  discussion.  Return in about 4 weeks (around 04/27/2023) for return OB, 28 week labs. I will send in some medication for her yeast. She is aware that she can use OTC Monistat.  Mirna Mires, CNM  03/30/2023 4:09 PM

## 2023-04-01 ENCOUNTER — Encounter: Payer: Self-pay | Admitting: Obstetrics

## 2023-04-01 DIAGNOSIS — B379 Candidiasis, unspecified: Secondary | ICD-10-CM

## 2023-04-01 DIAGNOSIS — F419 Anxiety disorder, unspecified: Secondary | ICD-10-CM

## 2023-04-01 MED ORDER — TERCONAZOLE 0.4 % VA CREA: 1.0000 | TOPICAL_CREAM | Freq: Every day | VAGINAL | 0 refills | Status: DC

## 2023-04-01 MED ORDER — SERTRALINE HCL 100 MG PO TABS: 100.0000 mg | ORAL_TABLET | Freq: Every day | ORAL | 0 refills | Status: DC

## 2023-04-01 MED ORDER — FLUCONAZOLE 150 MG PO TABS: 150.0000 mg | ORAL_TABLET | Freq: Once | ORAL | 0 refills | Status: AC

## 2023-04-01 NOTE — Telephone Encounter (Signed)
Spoke with patient to clarify yeast infection. She advised she discussed with Claris Che at her appointment on Monday. Claris Che had advised she would send rx for cream and refill patient's zoloft. Advised patient will check with Claris Che and get rx for cream sent in.

## 2023-04-01 NOTE — Telephone Encounter (Signed)
Notified pharmacy to cancel rx for Diflucan, provider has sent rx for Terconazole cream.

## 2023-04-01 NOTE — Telephone Encounter (Signed)
Per secure chat with Claris Che:  ok. I sent in a prx for the Terconazole cream

## 2023-04-01 NOTE — Telephone Encounter (Signed)
Patient aware.

## 2023-04-01 NOTE — Addendum Note (Signed)
Addended by: Mirna Mires on: 04/01/2023 04:13 PM   Modules accepted: Orders

## 2023-04-15 NOTE — L&D Delivery Note (Signed)
 Delivery Note   Alexa Grant is a 28 y.o. G1P0000 at [redacted]w[redacted]d Estimated Date of Delivery: 07/26/23  PRE-OPERATIVE DIAGNOSIS:  1) [redacted]w[redacted]d pregnancy.  2) Ehler's-Danlos  3) Anxiety 4) Rubella Non Immune   POST-OPERATIVE DIAGNOSIS:  1) [redacted]w[redacted]d pregnancy s/p Vaginal, Spontaneous 2) Labial laceration- right side- not repaired   Delivery Type: Vaginal, Spontaneous   Delivery Anesthesia: None  Labor Complications:  none     ESTIMATED BLOOD LOSS: 200 ml    FINDINGS:   1) female infant, Apgar scores of 7   at 1 minute and 7   at 5 minutes and birthweight pending.   SPECIMENS:   PLACENTA:   Appearance: Intact   Removal: Spontaneous     Disposition:    CORD BLOOD: discarded   DISPOSITION:  Infant left in stable condition in the delivery room, with L&D personnel and mother,  NARRATIVE SUMMARY: Labor course:  [name] is a G1P0000 at [redacted]w[redacted]d who presented to Labor & Delivery for labor management. Her initial cervical exam was 3/70/-1. Labor proceeded (spontaneously/with augmentation) and she was found to be completely dilated at 1023. With excellent maternal pushing effort, she birthed a viable female infant at 1045 in hands and knees. There was no nuchal cord. The shoulders were birthed without difficulty. [name]'s husband Kennedy Bucker assisted with hand-over-hand delivery. The infant was passed through by myself and Kennedy Bucker and [name] gently pulled the infant up to her abdomen, skin to skin. The cord was doubly clamped and cut by [facility] when pulsations ceased. The placenta delivered spontaneously and was noted to be intact with a 3VC.  A perineal and vaginal examination was performed. Episiotomy/Lacerations: Right Labial, hemostatic and not repaired.  The patient tolerated this well. Mother and baby were left in stable condition.   [name], Student-MidWife 07/19/2023 11:20 AM  Hartley Barefoot, CNM was present for all portions of care.

## 2023-04-27 ENCOUNTER — Ambulatory Visit (INDEPENDENT_AMBULATORY_CARE_PROVIDER_SITE_OTHER): Payer: BC Managed Care – PPO | Admitting: Certified Nurse Midwife

## 2023-04-27 ENCOUNTER — Other Ambulatory Visit: Payer: BC Managed Care – PPO

## 2023-04-27 ENCOUNTER — Encounter: Payer: Self-pay | Admitting: Certified Nurse Midwife

## 2023-04-27 ENCOUNTER — Other Ambulatory Visit: Payer: Self-pay

## 2023-04-27 VITALS — BP 99/65 | HR 87 | Wt 168.0 lb

## 2023-04-27 DIAGNOSIS — Z23 Encounter for immunization: Secondary | ICD-10-CM

## 2023-04-27 DIAGNOSIS — D649 Anemia, unspecified: Secondary | ICD-10-CM

## 2023-04-27 DIAGNOSIS — Z3A27 27 weeks gestation of pregnancy: Secondary | ICD-10-CM

## 2023-04-27 DIAGNOSIS — Z113 Encounter for screening for infections with a predominantly sexual mode of transmission: Secondary | ICD-10-CM

## 2023-04-27 DIAGNOSIS — Z131 Encounter for screening for diabetes mellitus: Secondary | ICD-10-CM

## 2023-04-27 DIAGNOSIS — Z3482 Encounter for supervision of other normal pregnancy, second trimester: Secondary | ICD-10-CM

## 2023-04-27 LAB — POCT URINALYSIS DIPSTICK OB
Bilirubin, UA: NEGATIVE
Blood, UA: NEGATIVE
Glucose, UA: NEGATIVE
Ketones, UA: NEGATIVE
Leukocytes, UA: NEGATIVE
Nitrite, UA: NEGATIVE
POC,PROTEIN,UA: NEGATIVE
Spec Grav, UA: 1.01 (ref 1.010–1.025)
Urobilinogen, UA: 0.2 U/dL
pH, UA: 7 (ref 5.0–8.0)

## 2023-04-27 NOTE — Progress Notes (Signed)
 ROB doing well. Feels good movement. 28 wk labs today: Glucose screen/RPR/CBC. Tdap done, Blood transfusion consent completed, all questions answered. Ready set baby reviewed, see check list for topics covered. Sample birth plan given, will follow up in upcoming visits. Discussed birth control after delivery, information pamphlet given.   Follow up 2 wk  for ROB or sooner if needed.    Zelda Hummer, CNM

## 2023-04-27 NOTE — Patient Instructions (Addendum)
 Oral Glucose Tolerance Test During Pregnancy Why am I having this test? The oral glucose tolerance test (OGTT) is done to check how your body processes blood sugar (glucose). This is one of several tests used to diagnose diabetes that develops during pregnancy (gestational diabetes mellitus). Gestational diabetes is a short-term form of diabetes that some women develop while they are pregnant. It usually occurs during the second trimester of pregnancy and goes away after delivery. Testing, or screening, for gestational diabetes usually occurs at weeks 24-28 of pregnancy. You may have the OGTT test after having a 1-hour glucose screening test if the results from that test indicate that you may have gestational diabetes. This test may also be needed if: You have a history of gestational diabetes. There is a history of giving birth to very large babies or of losing pregnancies (having stillbirths). You have signs and symptoms of diabetes, such as: Changes in your eyesight. Tingling or numbness in your hands or feet. Changes in hunger, thirst, and urination, and these are not explained by your pregnancy. What is being tested? This test measures the amount of glucose in your blood at different times during a period of 3 hours. This shows how well your body can process glucose. What kind of sample is taken?  Blood samples are required for this test. They are usually collected by inserting a needle into a blood vessel. How do I prepare for this test? For 3 days before your test, eat normally. Have plenty of carbohydrate-rich foods. Follow instructions from your health care provider about: Eating or drinking restrictions on the day of the test. You may be asked not to eat or drink anything other than water (to fast) starting 8-10 hours before the test. Changing or stopping your regular medicines. Some medicines may interfere with this test. Tell a health care provider about: All medicines you are  taking, including vitamins, herbs, eye drops, creams, and over-the-counter medicines. Any blood disorders you have. Any surgeries you have had. Any medical conditions you have. What happens during the test? First, your blood glucose will be measured. This is referred to as your fasting blood glucose because you fasted before the test. Then, you will drink a glucose solution that contains a certain amount of glucose. Your blood glucose will be measured again 1, 2, and 3 hours after you drink the solution. This test takes about 3 hours to complete. You will need to stay at the testing location during this time. During the testing period: Do not eat or drink anything other than the glucose solution. Do not exercise. Do not use any products that contain nicotine or tobacco, such as cigarettes, e-cigarettes, and chewing tobacco. These can affect your test results. If you need help quitting, ask your health care provider. The testing procedure may vary among health care providers and hospitals. How are the results reported? Your results will be reported as milligrams of glucose per deciliter of blood (mg/dL) or millimoles per liter (mmol/L). There is more than one source for screening and diagnosis reference values used to diagnose gestational diabetes. Your health care provider will compare your results to normal values that were established after testing a large group of people (reference values). Reference values may vary among labs and hospitals. For this test (Carpenter-Coustan), reference values are: Fasting: 95 mg/dL (5.3 mmol/L). 1 hour: 180 mg/dL (89.9 mmol/L). 2 hour: 155 mg/dL (8.6 mmol/L). 3 hour: 140 mg/dL (7.8 mmol/L). What do the results mean? Results below the reference values are  considered normal. If two or more of your blood glucose levels are at or above the reference values, you may be diagnosed with gestational diabetes. If only one level is high, your health care provider may  suggest repeat testing or other tests to confirm a diagnosis. Talk with your health care provider about what your results mean. Questions to ask your health care provider Ask your health care provider, or the department that is doing the test: When will my results be ready? How will I get my results? What are my treatment options? What other tests do I need? What are my next steps? Summary The oral glucose tolerance test (OGTT) is one of several tests used to diagnose diabetes that develops during pregnancy (gestational diabetes mellitus). Gestational diabetes is a short-term form of diabetes that some women develop while they are pregnant. You may have the OGTT test after having a 1-hour glucose screening test if the results from that test show that you may have gestational diabetes. You may also have this test if you have any symptoms or risk factors for this type of diabetes. Talk with your health care provider about what your results mean. This information is not intended to replace advice given to you by your health care provider. Make sure you discuss any questions you have with your health care provider. Document Revised: 11/05/2021 Document Reviewed: 09/08/2019 Elsevier Patient Education  2024 [employer].  Tdap (Tetanus, Diphtheria, Pertussis) Vaccine: What You Need to Know Many vaccine information statements are available in Spanish and other languages. See promoage.com.br. 1. Why get vaccinated? Tdap vaccine can prevent tetanus, diphtheria, and pertussis. Diphtheria and pertussis spread from person to person. Tetanus enters the body through cuts or wounds. TETANUS (T) causes painful stiffening of the muscles. Tetanus can lead to serious health problems, including being unable to open the mouth, having trouble swallowing and breathing, or death. DIPHTHERIA (D) can lead to difficulty breathing, heart failure, paralysis, or death. PERTUSSIS (aP), also known as whooping cough,  can cause uncontrollable, violent coughing that makes it hard to breathe, eat, or drink. Pertussis can be extremely serious especially in babies and young children, causing pneumonia, convulsions, brain damage, or death. In teens and adults, it can cause weight loss, loss of bladder control, passing out, and rib fractures from severe coughing. 2. Tdap vaccine Tdap is only for children 7 years and older, adolescents, and adults.  Adolescents should receive a single dose of Tdap, preferably at age 48 or 12 years. Pregnant people should get a dose of Tdap during every pregnancy, preferably during the early part of the third trimester, to help protect the newborn from pertussis. Infants are most at risk for severe, life-threatening complications from pertussis. Adults who have never received Tdap should get a dose of Tdap. Also, adults should receive a booster dose of either Tdap or Td (a different vaccine that protects against tetanus and diphtheria but not pertussis) every 10 years, or after 5 years in the case of a severe or dirty wound or burn. Tdap may be given at the same time as other vaccines. 3. Talk with your health care provider Tell your vaccine provider if the person getting the vaccine: Has had an allergic reaction after a previous dose of any vaccine that protects against tetanus, diphtheria, or pertussis, or has any severe, life-threatening allergies Has had a coma, decreased level of consciousness, or prolonged seizures within 7 days after a previous dose of any pertussis vaccine (DTP, DTaP, or Tdap) Has  seizures or another nervous system problem Has ever had Guillain-Barr Syndrome (also called GBS) Has had severe pain or swelling after a previous dose of any vaccine that protects against tetanus or diphtheria In some cases, your health care provider may decide to postpone Tdap vaccination until a future visit. People with minor illnesses, such as a cold, may be vaccinated. People who  are moderately or severely ill should usually wait until they recover before getting Tdap vaccine.  Your health care provider can give you more information. 4. Risks of a vaccine reaction Pain, redness, or swelling where the shot was given, mild fever, headache, feeling tired, and nausea, vomiting, diarrhea, or stomachache sometimes happen after Tdap vaccination. People sometimes faint after medical procedures, including vaccination. Tell your provider if you feel dizzy or have vision changes or ringing in the ears.  As with any medicine, there is a very remote chance of a vaccine causing a severe allergic reaction, other serious injury, or death. 5. What if there is a serious problem? An allergic reaction could occur after the vaccinated person leaves the clinic. If you see signs of a severe allergic reaction (hives, swelling of the face and throat, difficulty breathing, a fast heartbeat, dizziness, or weakness), call 9-1-1 and get the person to the nearest hospital. For other signs that concern you, call your health care provider.  Adverse reactions should be reported to the Vaccine Adverse Event Reporting System (VAERS). Your health care provider will usually file this report, or you can do it yourself. Visit the VAERS website at www.vaers.lagents.no or call 223-431-6960. VAERS is only for reporting reactions, and VAERS staff members do not give medical advice. 6. The National Vaccine Injury Compensation Program The [employer] Vaccine Injury Compensation Program (VICP) is a federal program that was created to compensate people who may have been injured by certain vaccines. Claims regarding alleged injury or death due to vaccination have a time limit for filing, which may be as short as two years. Visit the VICP website at spiritualword.at or call (616)417-5136 to learn about the program and about filing a claim. 7. How can I learn more? Ask your health care provider. Call your local or  state health department. Visit the website of the Food and Drug Administration (FDA) for vaccine package inserts and additional information at finderlist.no. Contact the Centers for Disease Control and Prevention (CDC): Call 743 701 4953 (1-800-CDC-INFO) or Visit CDC's website at piccapture.uy. Source: CDC Vaccine Information Statement Tdap (Tetanus, Diphtheria, Pertussis) Vaccine (11/18/2019) This same material is available at footballexhibition.com.br for no charge. This information is not intended to replace advice given to you by your health care provider. Make sure you discuss any questions you have with your health care provider. Document Revised: 07/16/2022 Document Reviewed: 05/16/2022 Elsevier Patient Education  2024 [employer].

## 2023-04-28 LAB — 28 WEEK RH+PANEL
Basophils Absolute: 0 10*3/uL (ref 0.0–0.2)
Basos: 0 %
EOS (ABSOLUTE): 0.1 10*3/uL (ref 0.0–0.4)
Eos: 2 %
Gestational Diabetes Screen: 95 mg/dL (ref 70–139)
HIV Screen 4th Generation wRfx: NONREACTIVE
Hematocrit: 37.3 % (ref 34.0–46.6)
Hemoglobin: 11.7 g/dL (ref 11.1–15.9)
Immature Grans (Abs): 0.1 10*3/uL (ref 0.0–0.1)
Immature Granulocytes: 1 %
Lymphocytes Absolute: 1.2 10*3/uL (ref 0.7–3.1)
Lymphs: 16 %
MCH: 30.5 pg (ref 26.6–33.0)
MCHC: 31.4 g/dL — ABNORMAL LOW (ref 31.5–35.7)
MCV: 97 fL (ref 79–97)
Monocytes Absolute: 0.6 10*3/uL (ref 0.1–0.9)
Monocytes: 8 %
Neutrophils Absolute: 5.4 10*3/uL (ref 1.4–7.0)
Neutrophils: 73 %
Platelets: 159 10*3/uL (ref 150–450)
RBC: 3.83 x10E6/uL (ref 3.77–5.28)
RDW: 12.6 % (ref 11.7–15.4)
RPR Ser Ql: NONREACTIVE
WBC: 7.4 10*3/uL (ref 3.4–10.8)

## 2023-05-11 ENCOUNTER — Ambulatory Visit (INDEPENDENT_AMBULATORY_CARE_PROVIDER_SITE_OTHER): Payer: BC Managed Care – PPO

## 2023-05-11 ENCOUNTER — Other Ambulatory Visit (HOSPITAL_COMMUNITY)
Admission: RE | Admit: 2023-05-11 | Discharge: 2023-05-11 | Disposition: A | Discharge status: Home / Self Care | Payer: BC Managed Care – PPO | Source: Ambulatory Visit

## 2023-05-11 VITALS — BP 97/60 | HR 81 | Wt 167.4 lb

## 2023-05-11 DIAGNOSIS — Z3A29 29 weeks gestation of pregnancy: Secondary | ICD-10-CM | POA: Insufficient documentation

## 2023-05-11 DIAGNOSIS — Z0289 Encounter for other administrative examinations: Secondary | ICD-10-CM

## 2023-05-11 DIAGNOSIS — Z3480 Encounter for supervision of other normal pregnancy, unspecified trimester: Secondary | ICD-10-CM | POA: Insufficient documentation

## 2023-05-11 DIAGNOSIS — N898 Other specified noninflammatory disorders of vagina: Secondary | ICD-10-CM | POA: Insufficient documentation

## 2023-05-11 DIAGNOSIS — F419 Anxiety disorder, unspecified: Secondary | ICD-10-CM

## 2023-05-11 DIAGNOSIS — O26893 Other specified pregnancy related conditions, third trimester: Secondary | ICD-10-CM | POA: Diagnosis present

## 2023-05-11 DIAGNOSIS — F32A Depression, unspecified: Secondary | ICD-10-CM

## 2023-05-11 DIAGNOSIS — O99343 Other mental disorders complicating pregnancy, third trimester: Secondary | ICD-10-CM

## 2023-05-11 DIAGNOSIS — Z23 Encounter for immunization: Secondary | ICD-10-CM

## 2023-05-11 NOTE — Assessment & Plan Note (Signed)
Magnesium glycinate recommended for restless legs- Alexa Grant will  keep [place] updated if this does not help.  Return of symptoms of vaginitis (discharge and itching)- swab done today.  SI joint pain- feels it is manageable at this time but discussed using pregnancy support band on the earlier side in the setting of ED.  Intermittent peripheral visual changes- we discussed physiologic changes of pregnancy blood volume and slow position changes. If her symptoms increase, consider labs at that time, as she is normotensive and has no other symptoms of pre-eclampsia. Pre-eclampsia warning signs discussed.  Third trimester precautions discussed, as well as schedule of prenatal visits.

## 2023-05-11 NOTE — Progress Notes (Signed)
    Return Prenatal Note   Subjective   28 y.o. G1P0000 at [redacted]w[redacted]d presents for this follow-up prenatal visit.  Thersa is doing well overall. Feeling Baby Mallory move lots! She is having some SI joint pain, as well as feeling restless legs at night. She is also experiencing some intermittent peripheral visual changes. All questions encouraged and answered. See problem list.   Movement: Present Contractions: Not present  Objective   Flow sheet Vitals: Pulse Rate: 81 BP: 97/60 Fundal Height: 28 cm Fetal Heart Rate (bpm): 135 Total weight gain: 10.2 kg  General Appearance  No acute distress, well appearing, and well nourished Pulmonary   Normal work of breathing Neurologic   Alert and oriented to person, place, and time Psychiatric   Mood and affect within normal limits  Assessment/Plan   Plan  28 y.o. G1P0000 at [redacted]w[redacted]d presents for follow-up OB visit. Reviewed prenatal record including previous visit note.  Anxiety and depression Continue current dose of sertraline.  Anticipatory guidance provided regarding postpartum mood.   Supervision of other normal pregnancy, antepartum Magnesium glycinate recommended for restless legs- Frayda will  keep [place] updated if this does not help.  Return of symptoms of vaginitis (discharge and itching)- swab done today.  SI joint pain- feels it is manageable at this time but discussed using pregnancy support band on the earlier side in the setting of ED.  Intermittent peripheral visual changes- we discussed physiologic changes of pregnancy blood volume and slow position changes. If her symptoms increase, consider labs at that time, as she is normotensive and has no other symptoms of pre-eclampsia. Pre-eclampsia warning signs discussed.  Third trimester precautions discussed, as well as schedule of prenatal visits.      No orders of the defined types were placed in this encounter.  Return in about 2 weeks (around 05/25/2023) for ROB.   Future  Appointments  Date Time Provider Department Center  05/25/2023  8:15 AM Dominica Severin, CNM [facility] None  06/08/2023  8:35 AM Julieanne Manson, MD [facility] None    For next visit:  continue with routine prenatal care     [name], Student-MidWife  01/27/259:58 AM  Patient seen in coordination with Autumn Messing, CNM.

## 2023-05-11 NOTE — Assessment & Plan Note (Signed)
Continue current dose of sertraline.  Anticipatory guidance provided regarding postpartum mood.

## 2023-05-12 LAB — CERVICOVAGINAL ANCILLARY ONLY
Bacterial Vaginitis (gardnerella): NEGATIVE
Candida Glabrata: NEGATIVE
Candida Vaginitis: POSITIVE — AB
Comment: NEGATIVE
Comment: NEGATIVE
Comment: NEGATIVE

## 2023-05-13 ENCOUNTER — Other Ambulatory Visit: Payer: Self-pay

## 2023-05-13 DIAGNOSIS — B3731 Acute candidiasis of vulva and vagina: Secondary | ICD-10-CM

## 2023-05-13 MED ORDER — TERCONAZOLE 0.4 % VA CREA: 1.0000 | TOPICAL_CREAM | Freq: Every day | VAGINAL | 0 refills | Status: DC

## 2023-05-25 ENCOUNTER — Ambulatory Visit (INDEPENDENT_AMBULATORY_CARE_PROVIDER_SITE_OTHER): Payer: BC Managed Care – PPO | Admitting: Certified Nurse Midwife

## 2023-05-25 ENCOUNTER — Encounter: Payer: Self-pay | Admitting: Certified Nurse Midwife

## 2023-05-25 VITALS — BP 107/70 | HR 95 | Wt 173.1 lb

## 2023-05-25 DIAGNOSIS — O368130 Decreased fetal movements, third trimester, not applicable or unspecified: Secondary | ICD-10-CM

## 2023-05-25 DIAGNOSIS — Z3A31 31 weeks gestation of pregnancy: Secondary | ICD-10-CM

## 2023-05-25 DIAGNOSIS — Z3480 Encounter for supervision of other normal pregnancy, unspecified trimester: Secondary | ICD-10-CM

## 2023-05-25 NOTE — Progress Notes (Signed)
    Return Prenatal Note   Subjective   28 y.o. G1P0000 at [redacted]w[redacted]d presents for this follow-up prenatal visit.  Patient reports decrease movement this weekend, feeling movements but they feel less strong and different than a few days ago. Questions answered about expectations in hospital care in labor. She would prefer no IV access due to history of difficult insertions for surgeries & anxiety related to this in particular. Patient reports: Movement: Present Contractions: Irritability  Objective   Flow sheet Vitals: Pulse Rate: 95 BP: 107/70 Fundal Height: 32 cm Fetal Heart Rate (bpm): 135 Presentation: Vertex ([place]) Total weight gain: 28 lb 1.6 oz (12.7 kg)  Fetus A Non-Stress Test Interpretation for 05/25/23  Indication: Decreased Fetal Movement  Fetal Heart Rate A Mode: External Baseline Rate (A): 135 bpm Variability: Moderate Accelerations: 10 x 10 Decelerations: None Multiple birth?: No  Uterine Activity Mode: Palpation, Toco Contraction Frequency (min): none noted Resting Tone Palpated: Relaxed Resting Time: Adequate  Interpretation (Fetal Testing) Nonstress Test Interpretation: Reactive Overall Impression: Reassuring for gestational age   General Appearance  No acute distress, well appearing, and well nourished Pulmonary   Normal work of breathing Neurologic   Alert and oriented to person, place, and time Psychiatric   Mood and affect within normal limits  Assessment/Plan   Plan  28 y.o. G1P0000 at [redacted]w[redacted]d presents for follow-up OB visit. Reviewed prenatal record including previous visit note.  Supervision of other normal pregnancy, antepartum Reviewed kick counts and preterm labor warning signs. Instructed to call office or come to hospital with persistent headache, vision changes, regular contractions, leaking of fluid, decreased fetal movement or vaginal bleeding. Reviewed usual L&D admission, timing of cervical exams and reasons to complete.  Discussed IV medlock vs no access, she is aware this may delay intervention or treatment, has hx of significant anxiety with IV placement with prior medical procedures.   Ultrasound for growth ordered given decreased fetal movement, NST reactive today. Resources for hypnobirthing instructors provided.   Orders Placed This Encounter  Procedures   US  OB Follow Up    Standing Status:   Future    Expected Date:   06/01/2023    Expiration Date:   08/22/2023    Reason for exam::   decreased fetal movement, growth assessment    Preferred imaging location?:   Internal   Return in 2 weeks (on 06/08/2023) for ROB.   Future Appointments  Date Time Provider Department Center  06/08/2023  8:35 AM Sofia Dunn, MD [facility] None  06/08/2023  9:45 AM [facility] US  1 [facility] None  06/22/2023  8:15 AM Slaughterbeck, Sherline Distel, CNM [facility] None    For next visit:  continue with routine prenatal care     Forestine Igo, CNM  05/24/2509:26 AM

## 2023-05-25 NOTE — Assessment & Plan Note (Signed)
 Reviewed kick counts and preterm labor warning signs. Instructed to call office or come to hospital with persistent headache, vision changes, regular contractions, leaking of fluid, decreased fetal movement or vaginal bleeding. Reviewed usual L&D admission, timing of cervical exams and reasons to complete. Discussed IV medlock vs no access, she is aware this may delay intervention or treatment, has hx of significant anxiety with IV placement with prior medical procedures.

## 2023-05-25 NOTE — Patient Instructions (Signed)
 Third Trimester of Pregnancy  The third trimester of pregnancy is from week 28 through week 40. This is months 7 through 9. The third trimester is a time when your baby is growing fast. Body changes during your third trimester Your body continues to change during this time. The changes usually go away after your baby is born. Physical changes You will continue to gain weight. You may get stretch marks on your hips, belly, and breasts. Your breasts will keep growing and may hurt. A yellow fluid (colostrum) may leak from your breasts. This is the first milk you're making for your baby. Your hair may grow faster and get thicker. In some cases, you may get hair loss. Your belly button may stick out. You may have more swelling in your hands, face, or ankles. Health changes You may have heartburn. You may feel short of breath. This is caused by the uterus that is now bigger. You may have more aches in the pelvis, back, or thighs. You may have more tingling or numbness in your hands, arms, and legs. You may pee more often. You may have trouble pooping (constipation) or swollen veins in the butt that can itch or get painful (hemorrhoids). Other changes You may have more problems sleeping. You may notice the baby moving lower in your belly (dropping). You may have more fluid coming from your vagina. Your joints may feel loose, and you may have pain around your pelvic bone. Follow these instructions at home: Medicines Take medicines only as told by your health care provider. Some medicines are not safe during pregnancy. Your provider may change the medicines that you take. Do not take any medicines unless told to by your provider. Take a prenatal vitamin that has at least 600 micrograms (mcg) of folic acid. Do not use herbal medicines, illegal drugs, or medicines that are not approved by your provider. Eating and drinking While you're pregnant your body needs additional nutrition to help  support your growing baby. Talk with your provider about your nutritional needs. Activity Most women are able to exercise regularly during pregnancy. Exercise routines may need to change at the end of your pregnancy. Talk to your provider about your activities and exercise routine. Relieving pain and discomfort Rest often with your legs raised if you have leg cramps or low back pain. Take warm sitz baths to soothe pain from hemorrhoids. Use hemorrhoid cream if your provider says it's okay. Wear a good, supportive bra if your breasts hurt. Do not use hot tubs, steam rooms, or saunas. Do not douche. Do not use tampons or scented pads. Safety Talk to your provider before traveling far distances. Wear your seatbelt at all times when you're in a car. Talk to your provider if someone hits you, hurts you, or yells at you. Preparing for birth To prepare for your baby: Take childbirth and breastfeeding classes. Visit the hospital and tour the maternity area. Buy a rear-facing car seat. Learn how to install it in your car. General instructions Avoid cat litter boxes and soil used by cats. These things carry germs that can cause harm to your pregnancy and your baby. Do not drink alcohol, smoke, vape, or use products with nicotine or tobacco in them. If you need help quitting, talk with your provider. Keep all follow-up visits for your third trimester. Your provider will do more exams and tests during this trimester. Write down your questions. Take them to your prenatal visits. Your provider also will: Talk with you about  your overall health. Give you advice or refer you to specialists who can help with different needs, including: Mental health and counseling. Foods and healthy eating. Ask for help if you need help with food. Where to find more information American Pregnancy Association: americanpregnancy.org [employer]: acog.org Office on [employer] Health:  [employer] Contact a health care provider if: You have a headache that does not go away when you take medicine. You have any of these problems: You can't eat or drink. You have nausea and vomiting. You have watery poop (diarrhea) for 2 days or more. You have pain when you pee, or your pee smells bad. You have been sick for 2 days or more and aren't getting better. Contact your provider right away if: You have any of these coming from your vagina: Abnormal discharge. Bad-smelling fluid. Bleeding. Your baby is moving less than usual. You have signs of labor: You have any contractions, belly cramping, or have pain in your pelvis or lower back before 37 weeks of pregnancy (preterm labor). You have regular contractions that are less than 5 minutes apart. Your water breaks. You have symptoms of high blood pressure or preeclampsia. These include: A severe, throbbing headache that does not go away. Sudden or extreme swelling of your face, hands, legs, or feet. Vision problems: You see spots. You have blurry vision. Your eyes are sensitive to light. If you can't reach your provider, go to an urgent care or emergency room. Get help right away if: You faint, become confused, or can't think clearly. You have chest pain or trouble breathing. You have any kind of injury, such as from a fall or a car crash. These symptoms may be an emergency. Call 911 right away. Do not wait to see if the symptoms will go away. Do not drive yourself to the hospital. This information is not intended to replace advice given to you by your health care provider. Make sure you discuss any questions you have with your health care provider. Document Revised: 01/01/2023 Document Reviewed: 08/01/2022 Elsevier Patient Education  2024 [employer].

## 2023-05-27 ENCOUNTER — Telehealth: Payer: Self-pay

## 2023-05-27 NOTE — Telephone Encounter (Signed)
Patient reports she has taken tylenol and eaten yogurt, two grilled cheese and some ham. Her BP is now 108/64. She states her headache is worse and she is still dizzy. Patient advised per Hartley Barefoot, CNM to take either benadryl or sudafed with caffeine (soda or coffee). If she has heart palpitations or symptoms get worse, she was advsied to report to ED for evaluation.

## 2023-05-27 NOTE — Telephone Encounter (Signed)
Patient states she had a dizzy spell last night and is having another one now. She states she has blurred vision, like just before you pass out. Inquired if she is able to take her BP. She took BP 120/78 which is high for her as she is usually 100/60's. She states she does have a slight headache. She has eaten a bagel with cream cheese, fruit and had ~25 oz of water this morning. Advised to take tylenol for headache and to have a high protein snack. Advised to contact us back in one hour if these measures do not help.  Reviewed :

## 2023-06-08 ENCOUNTER — Ambulatory Visit (INDEPENDENT_AMBULATORY_CARE_PROVIDER_SITE_OTHER): Payer: BC Managed Care – PPO

## 2023-06-08 ENCOUNTER — Ambulatory Visit (INDEPENDENT_AMBULATORY_CARE_PROVIDER_SITE_OTHER): Payer: BC Managed Care – PPO | Admitting: Obstetrics

## 2023-06-08 VITALS — BP 93/57 | HR 79 | Wt 173.0 lb

## 2023-06-08 DIAGNOSIS — Z3A33 33 weeks gestation of pregnancy: Secondary | ICD-10-CM

## 2023-06-08 DIAGNOSIS — Q796 Ehlers-Danlos syndrome, unspecified: Secondary | ICD-10-CM | POA: Diagnosis not present

## 2023-06-08 DIAGNOSIS — O368130 Decreased fetal movements, third trimester, not applicable or unspecified: Secondary | ICD-10-CM | POA: Diagnosis not present

## 2023-06-08 DIAGNOSIS — F419 Anxiety disorder, unspecified: Secondary | ICD-10-CM

## 2023-06-08 DIAGNOSIS — O26893 Other specified pregnancy related conditions, third trimester: Secondary | ICD-10-CM

## 2023-06-08 DIAGNOSIS — O0990 Supervision of high risk pregnancy, unspecified, unspecified trimester: Secondary | ICD-10-CM

## 2023-06-08 DIAGNOSIS — O99343 Other mental disorders complicating pregnancy, third trimester: Secondary | ICD-10-CM | POA: Diagnosis not present

## 2023-06-08 DIAGNOSIS — F32A Depression, unspecified: Secondary | ICD-10-CM

## 2023-06-08 NOTE — Progress Notes (Addendum)
    Return Prenatal Note   Subjective  28 y.o. G1P0000 at [redacted]w[redacted]d presents for this follow-up prenatal visit. Pregnancy notable for maternal Ehlers-Danlos syndrome, anxiety and depression, rubella non-immune.   Patient is well today, here with husband. Some questions about safety of working until due date, is only getting 6wks for maternity leave.   Patient reports: Movement: Present Contractions: Not present Denies vaginal bleeding or leaking fluid. Objective  Flow sheet Vitals: Pulse Rate: 79 BP: (!) 93/57 Fundal Height: 33 cm Fetal Heart Rate (bpm): 135 Total weight gain: 28 lb (12.7 kg)  General Appearance  No acute distress, well appearing, and well nourished Pulmonary   Normal work of breathing Neurologic   Alert and oriented to person, place, and time Psychiatric   Mood and affect within normal limits  Assessment/Plan   Plan  28 y.o. G1P0000 at [redacted]w[redacted]d by 6wk [place] presents for follow-up OB visit. Reviewed prenatal record including previous visit note. 1. Supervision of high risk pregnancy, antepartum (Primary) -Growth [place] later today for earlier decreased FM, now resolved -Encouraged [facility], reviewed instructions -Ok to work until delivery, pt's preference; notify clinic if needing work restrictions.  2. 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 Anxiety and depression -Continue Sertraline 100mg  daily, notify clinic of mood changes  Return in about 2 weeks (around 06/22/2023) for ROB.   Future Appointments  Date Time Provider Department Center  06/08/2023  9:45 AM [facility] [place] 1 [facility] None  06/22/2023  8:15 AM Slaughterbeck, Irving Burton, CNM [facility] None  06/29/2023  8:15 AM Dominica Severin, CNM [facility] None  07/06/2023  8:15 AM Slaughterbeck, Irving Burton, CNM [facility] None  07/13/2023  8:15 AM Glenetta Borg, CNM [facility]  None  07/20/2023  8:15 AM Dominic, Courtney Heys, CNM [facility] None   For next visit:  continue with routine prenatal care   Julieanne Manson, DO [facility]

## 2023-06-08 NOTE — Patient Instructions (Signed)
 Third Trimester of Pregnancy  The third trimester of pregnancy is from week 28 through week 40. This is months 7 through 9. The third trimester is a time when your baby is growing fast. Body changes during your third trimester Your body continues to change during this time. The changes usually go away after your baby is born. Physical changes You will continue to gain weight. You may get stretch marks on your hips, belly, and breasts. Your breasts will keep growing and may hurt. A yellow fluid (colostrum) may leak from your breasts. This is the first milk you're making for your baby. Your hair may grow faster and get thicker. In some cases, you may get hair loss. Your belly button may stick out. You may have more swelling in your hands, face, or ankles. Health changes You may have heartburn. You may feel short of breath. This is caused by the uterus that is now bigger. You may have more aches in the pelvis, back, or thighs. You may have more tingling or numbness in your hands, arms, and legs. You may pee more often. You may have trouble pooping (constipation) or swollen veins in the butt that can itch or get painful (hemorrhoids). Other changes You may have more problems sleeping. You may notice the baby moving lower in your belly (dropping). You may have more fluid coming from your vagina. Your joints may feel loose, and you may have pain around your pelvic bone. Follow these instructions at home: Medicines Take medicines only as told by your health care provider. Some medicines are not safe during pregnancy. Your provider may change the medicines that you take. Do not take any medicines unless told to by your provider. Take a prenatal vitamin that has at least 600 micrograms (mcg) of folic acid. Do not use herbal medicines, illegal drugs, or medicines that are not approved by your provider. Eating and drinking While you're pregnant your body needs additional nutrition to help  support your growing baby. Talk with your provider about your nutritional needs. Activity Most women are able to exercise regularly during pregnancy. Exercise routines may need to change at the end of your pregnancy. Talk to your provider about your activities and exercise routine. Relieving pain and discomfort Rest often with your legs raised if you have leg cramps or low back pain. Take warm sitz baths to soothe pain from hemorrhoids. Use hemorrhoid cream if your provider says it's okay. Wear a good, supportive bra if your breasts hurt. Do not use hot tubs, steam rooms, or saunas. Do not douche. Do not use tampons or scented pads. Safety Talk to your provider before traveling far distances. Wear your seatbelt at all times when you're in a car. Talk to your provider if someone hits you, hurts you, or yells at you. Preparing for birth To prepare for your baby: Take childbirth and breastfeeding classes. Visit the hospital and tour the maternity area. Buy a rear-facing car seat. Learn how to install it in your car. General instructions Avoid cat litter boxes and soil used by cats. These things carry germs that can cause harm to your pregnancy and your baby. Do not drink alcohol, smoke, vape, or use products with nicotine or tobacco in them. If you need help quitting, talk with your provider. Keep all follow-up visits for your third trimester. Your provider will do more exams and tests during this trimester. Write down your questions. Take them to your prenatal visits. Your provider also will: Talk with you about  your overall health. Give you advice or refer you to specialists who can help with different needs, including: Mental health and counseling. Foods and healthy eating. Ask for help if you need help with food. Where to find more information American Pregnancy Association: americanpregnancy.org [employer]: acog.org Office on [employer] Health:  [employer] Contact a health care provider if: You have a headache that does not go away when you take medicine. You have any of these problems: You can't eat or drink. You have nausea and vomiting. You have watery poop (diarrhea) for 2 days or more. You have pain when you pee, or your pee smells bad. You have been sick for 2 days or more and aren't getting better. Contact your provider right away if: You have any of these coming from your vagina: Abnormal discharge. Bad-smelling fluid. Bleeding. Your baby is moving less than usual. You have signs of labor: You have any contractions, belly cramping, or have pain in your pelvis or lower back before 37 weeks of pregnancy (preterm labor). You have regular contractions that are less than 5 minutes apart. Your water breaks. You have symptoms of high blood pressure or preeclampsia. These include: A severe, throbbing headache that does not go away. Sudden or extreme swelling of your face, hands, legs, or feet. Vision problems: You see spots. You have blurry vision. Your eyes are sensitive to light. If you can't reach your provider, go to an urgent care or emergency room. Get help right away if: You faint, become confused, or can't think clearly. You have chest pain or trouble breathing. You have any kind of injury, such as from a fall or a car crash. These symptoms may be an emergency. Call 911 right away. Do not wait to see if the symptoms will go away. Do not drive yourself to the hospital. This information is not intended to replace advice given to you by your health care provider. Make sure you discuss any questions you have with your health care provider. Document Revised: 01/01/2023 Document Reviewed: 08/01/2022 Elsevier Patient Education  2024 [employer].

## 2023-06-10 ENCOUNTER — Encounter: Payer: Self-pay | Admitting: Certified Nurse Midwife

## 2023-06-10 DIAGNOSIS — O0990 Supervision of high risk pregnancy, unspecified, unspecified trimester: Secondary | ICD-10-CM

## 2023-06-12 NOTE — Telephone Encounter (Signed)
**Note De-identified Hilliary Jock Obfuscation** Please advise 

## 2023-06-22 ENCOUNTER — Ambulatory Visit (INDEPENDENT_AMBULATORY_CARE_PROVIDER_SITE_OTHER): Payer: BC Managed Care – PPO | Admitting: Certified Nurse Midwife

## 2023-06-22 VITALS — BP 107/70 | HR 85 | Wt 180.5 lb

## 2023-06-22 DIAGNOSIS — O4443 Low lying placenta NOS or without hemorrhage, third trimester: Secondary | ICD-10-CM | POA: Diagnosis not present

## 2023-06-22 DIAGNOSIS — Q796 Ehlers-Danlos syndrome, unspecified: Secondary | ICD-10-CM

## 2023-06-22 DIAGNOSIS — O0990 Supervision of high risk pregnancy, unspecified, unspecified trimester: Secondary | ICD-10-CM

## 2023-06-22 DIAGNOSIS — Z3A35 35 weeks gestation of pregnancy: Secondary | ICD-10-CM

## 2023-06-22 DIAGNOSIS — O4440 Low lying placenta NOS or without hemorrhage, unspecified trimester: Secondary | ICD-10-CM

## 2023-06-22 NOTE — Assessment & Plan Note (Signed)
 Discussed autonomy to decide she does not want an IV while in labor unless medically indicated.  Reviewed GBS testing next visit and recommendations for antibiotics if positive.  Baby breech on [place] last week. Vertex on BSUS at this visit.  Reviewed kick counts and preterm labor warning signs. Instructed to call office or come to hospital with persistent headache, vision changes, regular contractions, leaking of fluid, decreased fetal movement or vaginal bleeding.

## 2023-06-22 NOTE — Progress Notes (Signed)
    Return Prenatal Note   Assessment/Plan   Plan  28 y.o. G1P0000 at [redacted]w[redacted]d presents for follow-up OB visit. Reviewed prenatal record including previous visit note.  Supervision of high risk pregnancy, antepartum Discussed autonomy to decide she does not want an IV while in labor unless medically indicated.  Reviewed GBS testing next visit and recommendations for antibiotics if positive.  Baby breech on [place] last week. Vertex on BSUS at this visit.  Reviewed kick counts and preterm labor warning signs. Instructed to call office or come to hospital with persistent headache, vision changes, regular contractions, leaking of fluid, decreased fetal movement or vaginal bleeding.    No orders of the defined types were placed in this encounter.  No follow-ups on file.   Future Appointments  Date Time Provider Department Center  06/29/2023  8:15 AM Dominica Severin, CNM [facility] None  06/29/2023 10:15 AM [facility] [place] 1 [facility] None  07/06/2023  8:15 AM Ysenia Filice, Irving Burton, CNM [facility] None  07/13/2023  8:15 AM Glenetta Borg, CNM [facility] None  07/20/2023  8:15 AM Dominic, Courtney Heys, CNM [facility] None    For next visit:  continue with routine prenatal care     Subjective   27 y.o. G1P0000 at [redacted]w[redacted]d presents for this follow-up prenatal visit.  Patient wanted to discuss size of baby, when to bring birth preferences, breech presentation and IV in labor.  Patient reports: Movement: Present Contractions: Irritability  Objective   Flow sheet Vitals: Pulse Rate: 85 BP: 107/70 Fundal Height: 34 cm Fetal Heart Rate (bpm): 140 Presentation: Vertex (BSUS) Total weight gain: 35 lb 8 oz (16.1 kg)  General Appearance  No acute distress, well appearing, and well nourished Pulmonary   Normal work of breathing Neurologic   Alert and oriented to person, place, and time Psychiatric   Mood and affect within normal limits  Oley Balm, CNM  03/10/258:49 AM

## 2023-06-29 ENCOUNTER — Ambulatory Visit

## 2023-06-29 ENCOUNTER — Ambulatory Visit (INDEPENDENT_AMBULATORY_CARE_PROVIDER_SITE_OTHER): Payer: BC Managed Care – PPO | Admitting: Certified Nurse Midwife

## 2023-06-29 ENCOUNTER — Other Ambulatory Visit (HOSPITAL_COMMUNITY)
Admission: RE | Admit: 2023-06-29 | Discharge: 2023-06-29 | Disposition: A | Discharge status: Home / Self Care | Source: Ambulatory Visit | Attending: Certified Nurse Midwife | Admitting: Certified Nurse Midwife

## 2023-06-29 ENCOUNTER — Other Ambulatory Visit: Payer: Self-pay | Admitting: Certified Nurse Midwife

## 2023-06-29 VITALS — BP 107/72 | HR 77 | Wt 183.0 lb

## 2023-06-29 DIAGNOSIS — E669 Obesity, unspecified: Secondary | ICD-10-CM | POA: Diagnosis not present

## 2023-06-29 DIAGNOSIS — Z3A36 36 weeks gestation of pregnancy: Secondary | ICD-10-CM

## 2023-06-29 DIAGNOSIS — Z3685 Encounter for antenatal screening for Streptococcus B: Secondary | ICD-10-CM

## 2023-06-29 DIAGNOSIS — O99213 Obesity complicating pregnancy, third trimester: Secondary | ICD-10-CM

## 2023-06-29 DIAGNOSIS — Z113 Encounter for screening for infections with a predominantly sexual mode of transmission: Secondary | ICD-10-CM | POA: Insufficient documentation

## 2023-06-29 DIAGNOSIS — O0990 Supervision of high risk pregnancy, unspecified, unspecified trimester: Secondary | ICD-10-CM

## 2023-06-29 DIAGNOSIS — O4103X0 Oligohydramnios, third trimester, not applicable or unspecified: Secondary | ICD-10-CM | POA: Diagnosis not present

## 2023-06-29 DIAGNOSIS — F419 Anxiety disorder, unspecified: Secondary | ICD-10-CM

## 2023-06-29 DIAGNOSIS — Z3A34 34 weeks gestation of pregnancy: Secondary | ICD-10-CM | POA: Diagnosis not present

## 2023-06-29 DIAGNOSIS — F32A Depression, unspecified: Secondary | ICD-10-CM

## 2023-06-29 MED ORDER — SERTRALINE HCL 100 MG PO TABS
100.0000 mg | ORAL_TABLET | Freq: Every day | ORAL | 1 refills | Status: DC
Start: 2023-06-29 — End: 2023-12-26

## 2023-06-29 NOTE — Assessment & Plan Note (Addendum)
 Reviewed kick counts and preterm labor warning signs. Instructed to call office or come to hospital with persistent headache, vision changes, regular contractions, leaking of fluid, decreased fetal movement or vaginal bleeding.

## 2023-06-29 NOTE — Progress Notes (Signed)
    Return Prenatal Note   Subjective   28 y.o. G1P0000 at [redacted]w[redacted]d presents for this follow-up prenatal visit.  Patient feeling well, active baby. Working on birth plan, draft reviewed & usual procedures discussed. GBS today, she prefers no IV access but is in agreement with IV for GBS prophylaxis with positive culture. Ultrasound today to assess fluid level, growth. Needs zoloft refill, mood stable. Patient reports: Movement: Present Contractions: Not present  Objective   Flow sheet Vitals: Pulse Rate: 77 BP: 107/72 Fundal Height: 36 cm Fetal Heart Rate (bpm): 140 Presentation: Vertex Total weight gain: 38 lb (17.2 kg)  General Appearance  No acute distress, well appearing, and well nourished Pulmonary   Normal work of breathing Neurologic   Alert and oriented to person, place, and time Psychiatric   Mood and affect within normal limits  Assessment/Plan   Plan  28 y.o. G1P0000 at [redacted]w[redacted]d presents for follow-up OB visit. Reviewed prenatal record including previous visit note.  Supervision of high risk pregnancy, antepartum Reviewed kick counts and preterm labor warning signs. Instructed to call office or come to hospital with persistent headache, vision changes, regular contractions, leaking of fluid, decreased fetal movement or vaginal bleeding.      Orders Placed This Encounter  Procedures   Culture, beta strep (group b only)   Return in 1 week (on 07/06/2023) for ROB.   Future Appointments  Date Time Provider Department Center  07/06/2023  8:15 AM Slaughterbeck, Irving Burton, CNM [facility] None  07/13/2023  8:15 AM Glenetta Borg, CNM [facility] None  07/20/2023  8:15 AM Dominic, Courtney Heys, CNM [facility] None    For next visit:  continue with routine prenatal care     Dominica Severin, CNM  06/28/2509:08 AM

## 2023-06-29 NOTE — Patient Instructions (Signed)
 Third Trimester of Pregnancy  The third trimester of pregnancy is from week 28 through week 40. This is months 7 through 9. The third trimester is a time when your baby is growing fast. Body changes during your third trimester Your body continues to change during this time. The changes usually go away after your baby is born. Physical changes You will continue to gain weight. You may get stretch marks on your hips, belly, and breasts. Your breasts will keep growing and may hurt. A yellow fluid (colostrum) may leak from your breasts. This is the first milk you're making for your baby. Your hair may grow faster and get thicker. In some cases, you may get hair loss. Your belly button may stick out. You may have more swelling in your hands, face, or ankles. Health changes You may have heartburn. You may feel short of breath. This is caused by the uterus that is now bigger. You may have more aches in the pelvis, back, or thighs. You may have more tingling or numbness in your hands, arms, and legs. You may pee more often. You may have trouble pooping (constipation) or swollen veins in the butt that can itch or get painful (hemorrhoids). Other changes You may have more problems sleeping. You may notice the baby moving lower in your belly (dropping). You may have more fluid coming from your vagina. Your joints may feel loose, and you may have pain around your pelvic bone. Follow these instructions at home: Medicines Take medicines only as told by your health care provider. Some medicines are not safe during pregnancy. Your provider may change the medicines that you take. Do not take any medicines unless told to by your provider. Take a prenatal vitamin that has at least 600 micrograms (mcg) of folic acid. Do not use herbal medicines, illegal drugs, or medicines that are not approved by your provider. Eating and drinking While you're pregnant your body needs additional nutrition to help  support your growing baby. Talk with your provider about your nutritional needs. Activity Most women are able to exercise regularly during pregnancy. Exercise routines may need to change at the end of your pregnancy. Talk to your provider about your activities and exercise routine. Relieving pain and discomfort Rest often with your legs raised if you have leg cramps or low back pain. Take warm sitz baths to soothe pain from hemorrhoids. Use hemorrhoid cream if your provider says it's okay. Wear a good, supportive bra if your breasts hurt. Do not use hot tubs, steam rooms, or saunas. Do not douche. Do not use tampons or scented pads. Safety Talk to your provider before traveling far distances. Wear your seatbelt at all times when you're in a car. Talk to your provider if someone hits you, hurts you, or yells at you. Preparing for birth To prepare for your baby: Take childbirth and breastfeeding classes. Visit the hospital and tour the maternity area. Buy a rear-facing car seat. Learn how to install it in your car. General instructions Avoid cat litter boxes and soil used by cats. These things carry germs that can cause harm to your pregnancy and your baby. Do not drink alcohol, smoke, vape, or use products with nicotine or tobacco in them. If you need help quitting, talk with your provider. Keep all follow-up visits for your third trimester. Your provider will do more exams and tests during this trimester. Write down your questions. Take them to your prenatal visits. Your provider also will: Talk with you about  your overall health. Give you advice or refer you to specialists who can help with different needs, including: Mental health and counseling. Foods and healthy eating. Ask for help if you need help with food. Where to find more information American Pregnancy Association: americanpregnancy.org [employer]necologists: acog.org Office on [employer] Health:  [employer] Contact a health care provider if: You have a headache that does not go away when you take medicine. You have any of these problems: You can't eat or drink. You have nausea and vomiting. You have watery poop (diarrhea) for 2 days or more. You have pain when you pee, or your pee smells bad. You have been sick for 2 days or more and aren't getting better. Contact your provider right away if: You have any of these coming from your vagina: Abnormal discharge. Bad-smelling fluid. Bleeding. Your baby is moving less than usual. You have signs of labor: You have any contractions, belly cramping, or have pain in your pelvis or lower back before 37 weeks of pregnancy (preterm labor). You have regular contractions that are less than 5 minutes apart. Your water breaks. You have symptoms of high blood pressure or preeclampsia. These include: A severe, throbbing headache that does not go away. Sudden or extreme swelling of your face, hands, legs, or feet. Vision problems: You see spots. You have blurry vision. Your eyes are sensitive to light. If you can't reach your provider, go to an urgent care or emergency room. Get help right away if: You faint, become confused, or can't think clearly. You have chest pain or trouble breathing. You have any kind of injury, such as from a fall or a car crash. These symptoms may be an emergency. Call 911 right away. Do not wait to see if the symptoms will go away. Do not drive yourself to the hospital. This information is not intended to replace advice given to you by your health care provider. Make sure you discuss any questions you have with your health care provider. Document Revised: 01/01/2023 Document Reviewed: 08/01/2022 Elsevier Patient Education  2024 Elsevier Inc. Group B Streptococcus Infection During Pregnancy Group B Streptococcus (GBS) is a type of bacteria that is often found in healthy people. It is commonly found in the  rectum, vagina, and intestines. In people who are healthy and not pregnant, the bacteria rarely cause serious illness or complications. However, women who test positive for GBS during pregnancy can pass the bacteria to the baby during childbirth. This can cause serious infection in the baby after birth. Women with GBS may also have infections during their pregnancy or soon after childbirth. The infections include urinary tract infections (UTIs) or infections of the uterus. GBS also increases a woman's risk of complications during pregnancy, such as early labor or delivery, miscarriage, or stillbirth. Routine testing for GBS is recommended for all pregnant women. What are the causes? This condition is caused by bacteria called Streptococcus agalactiae. What increases the risk? You may have a higher risk for GBS infection during pregnancy if you had one during a past pregnancy. What are the signs or symptoms? In most cases, GBS infection does not cause symptoms in pregnant women. If symptoms exist, they may include: Labor that starts before the 37th week of pregnancy. A UTI or bladder infection. This may cause a fever, frequent urination, or pain and burning during urination. Fever during labor. There can also be a rapid heartbeat in the mother or baby. Rare but serious symptoms of a GBS  infection in women include: Blood infection (septicemia). This may cause fever, chills, or confusion. Lung infection (pneumonia). This may cause fever, chills, cough, rapid breathing, chest pain, or difficulty breathing. Bone, joint, skin, or soft tissue infection. How is this diagnosed? You may be screened for GBS between week 35 and week 37 of pregnancy. If you have symptoms of preterm labor, you may be screened earlier. This condition is diagnosed based on lab test results from: A swab of fluid from the vagina and rectum. A urine sample. How is this treated? This condition is treated with antibiotic medicine.  Antibiotic medicine may be given: To you when you go into labor, or as soon as your water breaks. The medicines will continue until after you give birth. If you are having a cesarean delivery, you do not need antibiotics unless your water has broken. To your baby, if he or she requires treatment. Your health care provider will check your baby to decide if he or she needs antibiotics to prevent a serious infection. Follow these instructions at home: Take over-the-counter and prescription medicines only as told by your health care provider. Take your antibiotic medicine as told by your health care provider. Do not stop taking the antibiotic even if you start to feel better. Keep all pre-birth (prenatal) visits and follow-up visits as told by your health care provider. This is important. Contact a health care provider if: You have pain or burning when you urinate. You have to urinate more often than usual. You have a fever or chills. You develop a bad-smelling vaginal discharge. Get help right away if: Your water breaks. You go into labor. You have severe pain in your abdomen. You have difficulty breathing. You have chest pain. These symptoms may represent a serious problem that is an emergency. Do not wait to see if the symptoms will go away. Get medical help right away. Call your local emergency services (911 in the [place] Do not drive yourself to the hospital. Summary GBS is a type of bacteria that is common in healthy people. During pregnancy, colonization with GBS can cause serious complications for you or your baby. Your health care provider will screen you between 35 and 37 weeks of pregnancy to determine if you are colonized with GBS. If you are colonized with GBS during pregnancy, your health care provider will recommend antibiotics through an IV during labor. After delivery, your baby will be evaluated for complications related to potential GBS infection and may require antibiotics to  prevent a serious infection. This information is not intended to replace advice given to you by your health care provider. Make sure you discuss any questions you have with your health care provider. Document Revised: 03/17/2022 Document Reviewed: 03/17/2022 Elsevier Patient Education  2024 [employer].

## 2023-06-30 LAB — CERVICOVAGINAL ANCILLARY ONLY
Chlamydia: NEGATIVE
Comment: NEGATIVE
Comment: NORMAL
Neisseria Gonorrhea: NEGATIVE

## 2023-07-03 LAB — CULTURE, BETA STREP (GROUP B ONLY): Strep Gp B Culture: NEGATIVE

## 2023-07-05 ENCOUNTER — Encounter: Payer: Self-pay | Admitting: Certified Nurse Midwife

## 2023-07-06 ENCOUNTER — Encounter: Payer: Self-pay | Admitting: Certified Nurse Midwife

## 2023-07-06 ENCOUNTER — Ambulatory Visit (INDEPENDENT_AMBULATORY_CARE_PROVIDER_SITE_OTHER): Payer: BC Managed Care – PPO

## 2023-07-06 VITALS — BP 105/75 | HR 81 | Wt 187.3 lb

## 2023-07-06 DIAGNOSIS — Z3A37 37 weeks gestation of pregnancy: Secondary | ICD-10-CM | POA: Diagnosis not present

## 2023-07-06 DIAGNOSIS — O0990 Supervision of high risk pregnancy, unspecified, unspecified trimester: Secondary | ICD-10-CM

## 2023-07-06 NOTE — Progress Notes (Signed)
    Return Prenatal Note   Assessment/Plan   Plan  28 y.o. G1P0000 at [redacted]w[redacted]d presents for follow-up OB visit. Reviewed prenatal record including previous visit note.  Supervision of high risk pregnancy, antepartum - Provided reassurance about amount of BHC.  - Reviewed birth preferences. Plans to decline saline lock. She is ok with IM Pitocin.  - Reviewed labor warning signs and expectations for birth. Instructed to call office or come to hospital with persistent headache, vision changes, regular contractions, leaking of fluid, decreased fetal movement or vaginal bleeding.   No orders of the defined types were placed in this encounter.  Return in about 1 week (around 07/13/2023) for ROB.   Future Appointments  Date Time Provider Department Center  07/13/2023  8:15 AM Glenetta Borg, CNM [facility] None  07/20/2023  8:15 AM Dominic, Courtney Heys, CNM [facility] None    For next visit:  continue with routine prenatal care     Subjective   28 y.o. G1P0000 at [redacted]w[redacted]d presents for this follow-up prenatal visit.  Patient has questions about how frequently she is feeling BHC. Has birth plan but forgot to bring today to scan. Does not want an IV but is ok if emergency requires it.  Patient reports: Movement: Present  Objective   Flow sheet Vitals: Pulse Rate: 81 BP: 105/75 Fundal Height: 37 cm Fetal Heart Rate (bpm): 130 Presentation: Vertex Total weight gain: 42 lb 4.8 oz (19.2 kg)  General Appearance  No acute distress, well appearing, and well nourished Pulmonary   Normal work of breathing Neurologic   Alert and oriented to person, place, and time Psychiatric   Mood and affect within normal limits  Lindalou Hose Paeton Latouche, CNM  03/24/259:11 AM

## 2023-07-06 NOTE — Assessment & Plan Note (Addendum)
-   Provided reassurance about amount of [facility].  - Reviewed birth preferences. Plans to decline saline lock. She is ok with IM Pitocin.  - Reviewed labor warning signs and expectations for birth. Instructed to call office or come to hospital with persistent headache, vision changes, regular contractions, leaking of fluid, decreased fetal movement or vaginal bleeding.

## 2023-07-06 NOTE — Progress Notes (Signed)
 Alexa Grant. Patient states daily fetal movement along with braxton hicks and pressure. GBS culture negative.

## 2023-07-13 ENCOUNTER — Ambulatory Visit (INDEPENDENT_AMBULATORY_CARE_PROVIDER_SITE_OTHER): Payer: BC Managed Care – PPO | Admitting: Obstetrics

## 2023-07-13 ENCOUNTER — Encounter: Payer: Self-pay | Admitting: Obstetrics

## 2023-07-13 ENCOUNTER — Other Ambulatory Visit (HOSPITAL_COMMUNITY)
Admission: RE | Admit: 2023-07-13 | Discharge: 2023-07-13 | Disposition: A | Discharge status: Home / Self Care | Source: Ambulatory Visit | Attending: Obstetrics | Admitting: Obstetrics

## 2023-07-13 VITALS — BP 95/63 | HR 77 | Wt 185.0 lb

## 2023-07-13 DIAGNOSIS — O26893 Other specified pregnancy related conditions, third trimester: Secondary | ICD-10-CM | POA: Insufficient documentation

## 2023-07-13 DIAGNOSIS — Z3A38 38 weeks gestation of pregnancy: Secondary | ICD-10-CM | POA: Diagnosis not present

## 2023-07-13 DIAGNOSIS — N898 Other specified noninflammatory disorders of vagina: Secondary | ICD-10-CM | POA: Insufficient documentation

## 2023-07-13 DIAGNOSIS — Z3A00 Weeks of gestation of pregnancy not specified: Secondary | ICD-10-CM | POA: Diagnosis not present

## 2023-07-13 DIAGNOSIS — O0990 Supervision of high risk pregnancy, unspecified, unspecified trimester: Secondary | ICD-10-CM

## 2023-07-13 NOTE — Assessment & Plan Note (Signed)
-   Reviewed birth plan, declines saline lock, desires IM pitocin.  - Reviewed danger signs and when to present.  - Reviewed anticipatory guidance and labor s/s - Wet prep collected

## 2023-07-13 NOTE — Progress Notes (Signed)
    Return Prenatal Note   Assessment/Plan   Plan  28 y.o. G1P0000 at [redacted]w[redacted]d presents for follow-up OB visit. Reviewed prenatal record including previous visit note.  Supervision of high risk pregnancy, antepartum - Reviewed birth plan, declines saline lock, desires IM pitocin.  - Reviewed danger signs and when to present.  - Reviewed anticipatory guidance and labor s/s - Wet prep collected    No orders of the defined types were placed in this encounter.  Return in about 1 week (around 07/20/2023).   Future Appointments  Date Time Provider Department Center  07/20/2023  8:15 AM Dominic, Courtney Heys, CNM [facility] None  07/27/2023  8:35 AM Free, Lindalou Hose, CNM [facility] None    For next visit:  Routine prenatal care    Subjective  Feeling well overall, feeling ready for baby. Rhyli brought her birth plan in today and we reviewed her preferences for low lights, music, and free movement during her labor. She has complaints of s/s of a yeast infection. Denies LOF, VB.   Movement: Present Contractions: Irritability  Objective   Flow sheet Vitals: Pulse Rate: 77 BP: 95/63 Fundal Height: 36 cm Fetal Heart Rate (bpm): 130 Dilation: Closed Effacement (%): Thick Station: Ballotable Total weight gain: 18.1 kg  General Appearance  No acute distress, well appearing, and well nourished Pulmonary   Normal work of breathing Neurologic   Alert and oriented to person, place, and time Psychiatric   Mood and affect within normal limits  Ulice Dash, [facility]  07/13/23 8:48 AM

## 2023-07-14 ENCOUNTER — Other Ambulatory Visit: Payer: Self-pay | Admitting: Obstetrics

## 2023-07-14 ENCOUNTER — Encounter: Payer: Self-pay | Admitting: Obstetrics

## 2023-07-14 LAB — CERVICOVAGINAL ANCILLARY ONLY
Bacterial Vaginitis (gardnerella): NEGATIVE
Candida Glabrata: NEGATIVE
Candida Vaginitis: POSITIVE — AB
Comment: NEGATIVE
Comment: NEGATIVE
Comment: NEGATIVE

## 2023-07-14 MED ORDER — FLUCONAZOLE 150 MG PO TABS: 150.0000 mg | ORAL_TABLET | Freq: Once | ORAL | 0 refills | Status: AC

## 2023-07-19 ENCOUNTER — Encounter: Payer: Self-pay | Admitting: Obstetrics and Gynecology

## 2023-07-19 ENCOUNTER — Inpatient Hospital Stay
Admission: EM | Admit: 2023-07-19 | Discharge: 2023-07-20 | DRG: 806 | Disposition: A | Discharge status: Home / Self Care | Attending: Certified Nurse Midwife | Admitting: Certified Nurse Midwife

## 2023-07-19 ENCOUNTER — Other Ambulatory Visit: Payer: Self-pay

## 2023-07-19 DIAGNOSIS — Q796 Ehlers-Danlos syndrome, unspecified: Secondary | ICD-10-CM | POA: Diagnosis not present

## 2023-07-19 DIAGNOSIS — O26893 Other specified pregnancy related conditions, third trimester: Secondary | ICD-10-CM | POA: Diagnosis present

## 2023-07-19 DIAGNOSIS — O99891 Other specified diseases and conditions complicating pregnancy: Secondary | ICD-10-CM

## 2023-07-19 DIAGNOSIS — F419 Anxiety disorder, unspecified: Secondary | ICD-10-CM

## 2023-07-19 DIAGNOSIS — O99344 Other mental disorders complicating childbirth: Secondary | ICD-10-CM | POA: Diagnosis present

## 2023-07-19 DIAGNOSIS — Z3A39 39 weeks gestation of pregnancy: Secondary | ICD-10-CM | POA: Diagnosis not present

## 2023-07-19 DIAGNOSIS — O479 False labor, unspecified: Principal | ICD-10-CM | POA: Diagnosis present

## 2023-07-19 DIAGNOSIS — O0990 Supervision of high risk pregnancy, unspecified, unspecified trimester: Secondary | ICD-10-CM

## 2023-07-19 DIAGNOSIS — O09899 Supervision of other high risk pregnancies, unspecified trimester: Secondary | ICD-10-CM

## 2023-07-19 DIAGNOSIS — Z833 Family history of diabetes mellitus: Secondary | ICD-10-CM | POA: Diagnosis not present

## 2023-07-19 DIAGNOSIS — Z2839 Other underimmunization status: Secondary | ICD-10-CM

## 2023-07-19 LAB — CBC
HCT: 34.6 % — ABNORMAL LOW (ref 36.0–46.0)
Hemoglobin: 11.5 g/dL — ABNORMAL LOW (ref 12.0–15.0)
MCH: 28.8 pg (ref 26.0–34.0)
MCHC: 33.2 g/dL (ref 30.0–36.0)
MCV: 86.7 fL (ref 80.0–100.0)
Platelets: 134 10*3/uL — ABNORMAL LOW (ref 150–400)
RBC: 3.99 MIL/uL (ref 3.87–5.11)
RDW: 13.6 % (ref 11.5–15.5)
WBC: 11.2 10*3/uL — ABNORMAL HIGH (ref 4.0–10.5)
nRBC: 0.3 % — ABNORMAL HIGH (ref 0.0–0.2)

## 2023-07-19 LAB — TYPE AND SCREEN
ABO/RH(D): A POS
Antibody Screen: NEGATIVE

## 2023-07-19 LAB — RPR: RPR Ser Ql: NONREACTIVE

## 2023-07-19 LAB — ABO/RH: ABO/RH(D): A POS

## 2023-07-19 MED ORDER — OXYTOCIN 10 UNIT/ML IJ SOLN
INTRAMUSCULAR | Status: DC
Start: 2023-07-19 — End: 2023-07-19
  Filled 2023-07-19: qty 2

## 2023-07-19 MED ORDER — AMMONIA AROMATIC IN INHA
RESPIRATORY_TRACT | Status: AC
  Filled 2023-07-19: qty 10

## 2023-07-19 MED ORDER — DOCUSATE SODIUM 100 MG PO CAPS
100.0000 mg | ORAL_CAPSULE | Freq: Two times a day (BID) | ORAL | Status: DC
  Administered 2023-07-19 – 2023-07-20 (×2): 100 mg via ORAL
  Filled 2023-07-19: qty 1

## 2023-07-19 MED ORDER — OXYTOCIN-SODIUM CHLORIDE 30-0.9 UT/500ML-% IV SOLN
2.5000 [IU]/h | INTRAVENOUS | Status: DC
  Filled 2023-07-19: qty 500

## 2023-07-19 MED ORDER — DIBUCAINE (PERIANAL) 1 % EX OINT: 1.0000 | TOPICAL_OINTMENT | CUTANEOUS | Status: DC | PRN

## 2023-07-19 MED ORDER — ONDANSETRON HCL 4 MG PO TABS: 4.0000 mg | ORAL_TABLET | ORAL | Status: DC | PRN

## 2023-07-19 MED ORDER — PRENATAL MULTIVITAMIN CH: 1.0000 | ORAL_TABLET | Freq: Every day | ORAL | Status: DC

## 2023-07-19 MED ORDER — SODIUM CHLORIDE 0.9 % IV SOLN: 250.0000 mL | INTRAVENOUS | Status: DC | PRN

## 2023-07-19 MED ORDER — SOD CITRATE-CITRIC ACID 500-334 MG/5ML PO SOLN: 30.0000 mL | ORAL | Status: DC | PRN

## 2023-07-19 MED ORDER — ONDANSETRON HCL 4 MG/2ML IJ SOLN: 4.0000 mg | Freq: Four times a day (QID) | INTRAMUSCULAR | Status: DC | PRN

## 2023-07-19 MED ORDER — ACETAMINOPHEN 500 MG PO TABS
1000.0000 mg | ORAL_TABLET | Freq: Four times a day (QID) | ORAL | Status: DC | PRN
  Administered 2023-07-19 – 2023-07-20 (×4): 1000 mg via ORAL
  Filled 2023-07-19 (×4): qty 2

## 2023-07-19 MED ORDER — PRENATAL MULTIVITAMIN CH
1.0000 | ORAL_TABLET | Freq: Every day | ORAL | Status: DC
  Administered 2023-07-19 – 2023-07-20 (×2): 1 via ORAL
  Filled 2023-07-19 (×2): qty 1

## 2023-07-19 MED ORDER — DIPHENHYDRAMINE HCL 25 MG PO CAPS: 25.0000 mg | ORAL_CAPSULE | Freq: Four times a day (QID) | ORAL | Status: DC | PRN

## 2023-07-19 MED ORDER — WITCH HAZEL-GLYCERIN EX PADS
1.0000 | MEDICATED_PAD | CUTANEOUS | Status: DC | PRN
  Administered 2023-07-19: 1 via TOPICAL
  Filled 2023-07-19: qty 100

## 2023-07-19 MED ORDER — LIDOCAINE HCL (PF) 1 % IJ SOLN
30.0000 mL | INTRAMUSCULAR | Status: DC | PRN
  Filled 2023-07-19: qty 30

## 2023-07-19 MED ORDER — ACETAMINOPHEN 325 MG PO TABS
650.0000 mg | ORAL_TABLET | ORAL | Status: DC | PRN
  Filled 2023-07-19: qty 2

## 2023-07-19 MED ORDER — SODIUM CHLORIDE 0.9% FLUSH: 3.0000 mL | Freq: Two times a day (BID) | INTRAVENOUS | Status: DC

## 2023-07-19 MED ORDER — OXYTOCIN BOLUS FROM INFUSION
333.0000 mL | Freq: Once | INTRAVENOUS | Status: AC
  Administered 2023-07-19: 333 mL via INTRAVENOUS

## 2023-07-19 MED ORDER — COCONUT OIL OIL
1.0000 | TOPICAL_OIL | Status: DC | PRN
  Filled 2023-07-19: qty 7.5

## 2023-07-19 MED ORDER — ONDANSETRON HCL 4 MG/2ML IJ SOLN: 4.0000 mg | INTRAMUSCULAR | Status: DC | PRN

## 2023-07-19 MED ORDER — SENNOSIDES-DOCUSATE SODIUM 8.6-50 MG PO TABS
2.0000 | ORAL_TABLET | Freq: Every day | ORAL | Status: DC
  Administered 2023-07-20: 2 via ORAL
  Filled 2023-07-19: qty 2

## 2023-07-19 MED ORDER — PROMETHAZINE HCL 25 MG/ML IJ SOLN
12.5000 mg | Freq: Four times a day (QID) | INTRAMUSCULAR | Status: DC | PRN
  Administered 2023-07-19: 12.5 mg via INTRAMUSCULAR
  Filled 2023-07-19: qty 1

## 2023-07-19 MED ORDER — ONDANSETRON 4 MG PO TBDP
4.0000 mg | ORAL_TABLET | Freq: Once | ORAL | Status: AC
  Administered 2023-07-19: 4 mg via ORAL
  Filled 2023-07-19: qty 1

## 2023-07-19 MED ORDER — MEASLES, MUMPS & RUBELLA VAC IJ SOLR
0.5000 mL | Freq: Once | INTRAMUSCULAR | Status: DC
  Filled 2023-07-19: qty 0.5

## 2023-07-19 MED ORDER — MISOPROSTOL 200 MCG PO TABS
ORAL_TABLET | ORAL | Status: AC
  Filled 2023-07-19: qty 4

## 2023-07-19 MED ORDER — SERTRALINE HCL 100 MG PO TABS
100.0000 mg | ORAL_TABLET | Freq: Every day | ORAL | Status: DC
  Administered 2023-07-19: 100 mg via ORAL
  Filled 2023-07-19 (×2): qty 1

## 2023-07-19 MED ORDER — SIMETHICONE 80 MG PO CHEW: 80.0000 mg | CHEWABLE_TABLET | ORAL | Status: DC | PRN

## 2023-07-19 MED ORDER — BENZOCAINE-MENTHOL 20-0.5 % EX AERO
1.0000 | INHALATION_SPRAY | CUTANEOUS | Status: DC | PRN
  Administered 2023-07-19: 1 via TOPICAL
  Filled 2023-07-19 (×2): qty 56

## 2023-07-19 MED ORDER — SODIUM CHLORIDE 0.9% FLUSH: 3.0000 mL | INTRAVENOUS | Status: DC | PRN

## 2023-07-19 MED ORDER — LACTATED RINGERS IV SOLN
500.0000 mL | INTRAVENOUS | Status: DC | PRN
  Administered 2023-07-19: 1000 mL via INTRAVENOUS

## 2023-07-19 MED ORDER — FENTANYL CITRATE (PF) 100 MCG/2ML IJ SOLN
50.0000 ug | INTRAMUSCULAR | Status: DC | PRN
Start: 2023-07-19 — End: 2023-07-19

## 2023-07-19 NOTE — Lactation Note (Signed)
 This note was copied from a baby's chart. Lactation Consultation Note  Patient Name: Alexa Grant [id number] Date: 07/19/2023 Age:26 hours Reason for consult: Follow-up assessment;Primapara;1st time breastfeeding;Term;Breastfeeding assistance;RN request   Maternal Data Lactation to room for a follow up assessment and possibly help with a feeding.  Patient stated that her nipples are very sore.    Feeding Mother's Current Feeding Choice: Breast Milk  Infant started to display feeding cues while present in room.  Patient positioned infant at the breast in football hold.  Initial latch was very sore and tender to patient.  LC took infant off the breast.  No damage to nipple, or misshaped nipple.    Infants diaper changed, and infant fell asleep.  Showed no interest in eating at the time.   Interventions Interventions: Education;Comfort gels;Shells;Coconut oil  LC provided patient with Sore Nipple Breast Shells and 2 Hydrogels.  Instructions were given on all items.  LC instructed patient that hydrogels are not to be used with lanolin or coconut oil.  Patient verbalized understanding.   Consult Status Consult Status: Follow-up Follow-up type: In-patient    Yvette Rack Brinda Focht 07/19/2023, 4:25 PM

## 2023-07-19 NOTE — Discharge Summary (Signed)
 Postpartum Discharge Summary  Date of Service updated***     Patient Name: Alexa Grant DOB: Aug 24, 1995 MRN: [id number]  Date of admission: 07/19/2023 Delivery date:07/19/2023 Delivering provider: Cindra Eves H Date of discharge: 07/19/2023  Admitting diagnosis: Uterine contractions [O47.9] Intrauterine pregnancy: [redacted]w[redacted]d     Secondary diagnosis:  Active Problems:   Supervision of high risk pregnancy, antepartum   Ehlers-Danlos syndrome   Rubella non-immune status, antepartum   Postpartum care following vaginal delivery   Vaginal delivery  Additional problems: ***    Discharge diagnosis: Term Pregnancy Delivered                                              Post partum procedures:{Postpartum procedures:23558} Augmentation: N/A Complications: None  Hospital course: Onset of Labor With Vaginal Delivery      28 y.o. yo G1P1001 at [redacted]w[redacted]d was admitted in early labor on 07/19/2023. Labor course was complicated by none  Membrane Rupture Time/Date: 10:23 AM,07/19/2023  Delivery Method:Vaginal, Spontaneous Operative Delivery:N/A Episiotomy: None Lacerations:  Labial Patient had a postpartum course complicated by ***.  She is ambulating, tolerating a regular diet, passing flatus, and urinating well. Patient is discharged home in stable condition on 07/19/23.  Newborn Data: Birth date:07/19/2023 Birth time:10:45 AM Gender:Female Living status:Living Apgars:7 ,7  Weight:3200 g  Magnesium Sulfate received: No BMZ received: No Rhophylac:N/A MMR:{MMR:[id number]} T-DaP:Given prenatally Flu: received prenatally RSV Vaccine received: No Transfusion:No Immunizations administered: Immunization History  Administered Date(s) Administered   Influenza Whole 02/28/2014   Influenza,inj,Quad PF,6+ Mos 12/08/2016, 01/15/2018, 01/09/2019   Influenza-Unspecified 01/12/2018, 01/10/2019   Moderna Covid-19 Vaccine Bivalent Booster 45yrs & up 06/15/2019, 07/22/2019   PPD Test 04/10/2020,  04/02/2021, 04/09/2021   Pfizer Covid-19 Vaccine Bivalent Booster 18yrs & up 09/14/2020   Tdap 04/27/2023    Physical exam  Vitals:   07/19/23 1145 07/19/23 1200 07/19/23 1235 07/19/23 1345  BP: 112/62 115/62 (!) 107/56 99/73  Pulse: 77 79 79 84  Resp:    18  Temp:    98.5 F (36.9 C)  TempSrc:    Oral  Weight:      Height:       General: {Exam; general:21111117} Lochia: {Desc; appropriate/inappropriate:30686::"appropriate"} Uterine Fundus: {Desc; firm/soft:30687} Incision: {Exam; incision:21111123} DVT Evaluation: {Exam; dvt:2111122} Labs: Lab Results  Component Value Date   WBC 11.2 (H) 07/19/2023   HGB 11.5 (L) 07/19/2023   HCT 34.6 (L) 07/19/2023   MCV 86.7 07/19/2023   PLT 134 (L) 07/19/2023      Latest Ref Rng & Units 07/13/2019    9:10 AM  CMP  Glucose 65 - 99 mg/dL 83   BUN 6 - 20 mg/dL 11   Creatinine 4.09 - 1.00 mg/dL 8.11   Sodium 914 - 782 mmol/L 140   Potassium 3.5 - 5.2 mmol/L 4.4   Chloride 96 - 106 mmol/L 103   CO2 20 - 29 mmol/L 24   Calcium 8.7 - 10.2 mg/dL 9.5   Total Protein 6.0 - 8.5 g/dL 7.7   Total Bilirubin 0.0 - 1.2 mg/dL 0.6   Alkaline Phos 39 - 117 IU/L 141   AST 0 - 40 IU/L 24   ALT 0 - 32 IU/L 21    Edinburgh Score:    12/10/2022    8:33 AM  Edinburgh Postnatal Depression Scale Screening Tool  I have been able to laugh and  see the funny side of things. 0  I have looked forward with enjoyment to things. 0  I have blamed myself unnecessarily when things went wrong. 0  I have been anxious or worried for no good reason. 1  I have felt scared or panicky for no good reason. 0  Things have been getting on top of me. 1  I have been so unhappy that I have had difficulty sleeping. 0  I have felt sad or miserable. 0  I have been so unhappy that I have been crying. 0  The thought of harming myself has occurred to me. 0  Edinburgh Postnatal Depression Scale Total 2      After visit meds:  Allergies as of 07/19/2023       Reactions    Nsaids Other (See Comments)   GI ulcer   Other Other (See Comments)   GI ulcer Seasonal: sneezing, runny nose, congestion, itchy watery eyes   Wound Dressings Hives, Itching     Med Rec must be completed prior to using this [facility]***        Discharge home in stable condition Infant Feeding: Breast Infant Disposition:home with mother Discharge instruction: per After Visit Summary and Postpartum booklet. Activity: Advance as tolerated. Pelvic rest for 6 weeks.  Diet: routine diet Anticipated Birth Control: POPs Postpartum Appointment:6 weeks Additional Postpartum F/U: Postpartum Depression checkup Future Appointments: Future Appointments  Date Time Provider Department Center  07/20/2023  8:15 AM Dominic, Courtney Heys, CNM [facility] None  07/27/2023  8:35 AM Free, Lindalou Hose, CNM [facility] None   Follow up Visit:  Follow-up Information     Dominica Severin, CNM Follow up in 2 week(s).   Specialty: Certified Nurse Midwife Why: 2 week mood check 6 week in-person postpartum visit Contact information: [address] [place] [address] [phone]                     07/19/2023 Dominica Severin, CNM

## 2023-07-19 NOTE — OB Triage Note (Signed)
 Patient is G1P0 is [redacted]w[redacted]d is seen at [facility]. Is here for contractions since 11pm tonight is stating 5/10 pain and about 5-7 minutes apart. Patient copy of birth plan in chart. Vaginal exam performed. Provider to be notified, VSS fhr 145 good variability.

## 2023-07-19 NOTE — Progress Notes (Signed)
 Alexa Grant is a 28 y.o. G1P0000 at [redacted]w[redacted]d by ultrasound admitted for labor.  Subjective: Alexa Grant is coping well with contractions. She has been changing positions frequently. Her mother and partner Kennedy Bucker are at the bedside.    Objective: BP 115/72   Pulse 89   Temp 98.2 F (36.8 C) (Oral)   Resp 18   Ht 5\' 5"  (1.651 m)   Wt 83.9 kg   LMP 10/07/2022   BMI 30.79 kg/m  No intake/output data recorded. No intake/output data recorded.  FHT:   120, accels noted. No decels noted. On intermittent monitoring.  UC:   regular, every 2 minutes SVE:   Dilation: 6.5 Effacement (%): 90 Station: -2 Exam by:: R. Wofford SNM  Labs: Lab Results  Component Value Date   WBC 11.2 (H) 07/19/2023   HGB 11.5 (L) 07/19/2023   HCT 34.6 (L) 07/19/2023   MCV 86.7 07/19/2023   PLT 134 (L) 07/19/2023    Assessment / Plan: Spontaneous labor, progressing normally -Intermittent monitoring -Continue expectant management -Discussed IM phenergan for persistent nausea and vomiting, as well as starting an IV for fluids in labor. Alexa Grant is amenable to both.  Labor: Progressing normally Preeclampsia:   n/a Fetal Wellbeing:   Reassuring on intermittent monitoring; accels noted, no decels.  Pain Control:  Labor support without pain medications; tried nitrous and experienced lightheadedness.  I/D:  n/a Anticipated MOD:  NSVD  [name], Student-MidWife 07/19/2023, 9:32 AM  Hartley Barefoot, CNM present for all portions of care.

## 2023-07-19 NOTE — Lactation Note (Signed)
 This note was copied from a baby's chart. Lactation Consultation Note  Patient Name: Girl Chelsea Pedretti ZOXWR'U Date: 07/19/2023 Age:26 hours Reason for consult: L&D Initial assessment;Primapara;1st time breastfeeding;Term;Breastfeeding assistance;Mother's request   Maternal Data Has patient been taught Hand Expression?: Yes Does the patient have breastfeeding experience prior to this delivery?: No  Initial assessment w/ P1 patient and 1hr old baby girl "Deloise".  This was SVD.  No factors affecting lactation at this time.  Patient stated that she would like to breastfeed.  Patient has a Medela pump at home but not sure which one.  Feeding Mother's Current Feeding Choice: Breast Milk  LC assisted patient with her first latch.  Infant very alert and eager to eat.  Patient has flat nipples at rest, but when stimulated are erect and short.   Pliable breast tissue.  Initially several attempts were made to get infant to latch.  After practice, infant latched to the rt breast in cradle hold for about 20 minutes.  Actively sucking and audible swallows heard.  Patient stated that she noticed cramping while infant was nursing.    Infant will need guidance to the breast and taught patient how to "sandwich" the breast to help infant latch deeper.  Infant is able to not only latch but sustain the latch.  Lips flanged out with audible swallows heard.   After nursing on the rt breast for 20 minutes, infant released.  After being put STS and an attempted burp, infant was placed in football hold on the left breast.   LATCH Score Latch: Repeated attempts needed to sustain latch, nipple held in mouth throughout feeding, stimulation needed to elicit sucking reflex.  Audible Swallowing: Spontaneous and intermittent  Type of Nipple: Everted at rest and after stimulation (Flat at rest; Infant brought nipple out;  short)  Comfort (Breast/Nipple): Soft / non-tender  Hold (Positioning): Assistance needed to  correctly position infant at breast and maintain latch.  LATCH Score: 8  Interventions Interventions: Breast feeding basics reviewed;Assisted with latch;Hand express;Breast compression;Adjust position;Support pillows;Position options;Education  LC provided education on the following;  milk production expectations, hunger cues, day 1/2 wet/dirty diapers, hand expression, cluster feeding, benefits of STS and arousing infant for a feeding.  Lactation informed patient of feeding infant at least 8 or more times w/in a 24hr period but not exceeding 3hrs. Patient verbalized understanding.   Discharge Pump: Personal (Medela)  Consult Status Consult Status: Follow-up Follow-up type: In-patient    Yvette Rack Teasha Murrillo 07/19/2023, 12:54 PM

## 2023-07-19 NOTE — H&P (Signed)
 Alexa Grant is a 28 y.o. female presenting for contractions. Her contractions began around 11pm on 4/5 and have intensified. She reports feeling contractions about every 5-7 minutes. She denies LOF, VB, and endorses good fetal movement. She is having some nausea and vomiting, and received ODT zofran in triage. Her cervix changed from 3/70/-1 to 4/70/-1 in triage over about 2 hrs.   Of note, Alexa Grant has a history of Ehler's-Danlos and has had several surgeries on her hips and shoulder. She had a cholecystectomy in 2017. She has a history of anxiety and takes sertraline. Alexa Grant was treated for a yeast infection on 4/1.   Alexa Grant desires a low intervention birth, and has brought her birth preferences in for review.   EFW 7# by Leopolds  OB History     Gravida  1   Para  0   Term  0   Preterm  0   AB  0   Living  0      SAB  0   IAB  0   Ectopic  0   Multiple  0   Live Births  0          Past Medical History:  Diagnosis Date   Biliary dyskinesia 01/25/2016   Family history of adverse reaction to anesthesia    dad gets sick with anesthesia   Gastric ulcer    GERD (gastroesophageal reflux disease)    takes Pantoprazole daily   Headache    History of migraine    few yrs ago   Joint pain    PONV (postoperative nausea and vomiting)    Seasonal allergies    Tylenol Sinus and Nasonex as needed   Vaccine for human papilloma virus (HPV) types 6, 11, 16, and 18 administered    Past Surgical History:  Procedure Laterality Date   CHOLECYSTECTOMY N/A 01/25/2016   Procedure: LAPAROSCOPIC CHOLECYSTECTOMY WITH INTRAOPERATIVE CHOLANGIOGRAM;  Surgeon: Claud Kelp, MD;  Location: [facility] OR;  Service: General;  Laterality: N/A;   COLONOSCOPY WITH ESOPHAGOGASTRODUODENOSCOPY (EGD)     HIP SURGERY Bilateral    labrum repair   HIP SURGERY Right 2023   remove hardware   SHOULDER SURGERY Bilateral    labrum repair   Family History: family history includes Breast cancer in her  paternal aunt; Congenital heart disease in her paternal grandfather; Diabetes in her paternal grandfather; Healthy in her father, maternal grandfather, maternal grandmother, mother, and sister; Liver disease in her paternal grandfather and paternal grandmother. Social History:  reports that she has never smoked. She has never used smokeless tobacco. She reports that she does not drink alcohol and does not use drugs.     Maternal Diabetes: No Genetic Screening: Normal Maternal Ultrasounds/Referrals: Normal Fetal Ultrasounds or other Referrals:  None Maternal Substance Abuse:  No Significant Maternal Medications:  Meds include: Zoloft Significant Maternal Lab Results:  Rubella NI Number of Prenatal Visits:greater than 3 verified prenatal visits Maternal Vaccinations:TDap Other Comments:  None  Review of Systems  Gastrointestinal:  Positive for vomiting.  All other systems reviewed and are negative.  History Dilation: 4 Effacement (%): 70 Station: -1 Exam by:: R Wolford SCNM Blood pressure 118/78, pulse 76, temperature 98 F (36.7 C), temperature source Oral, last menstrual period 10/07/2022. Maternal Exam:  Introitus: Normal vulva.   Physical Exam Constitutional:      Appearance: Normal appearance.  HENT:     Head: Normocephalic and atraumatic.  Cardiovascular:     Rate and Rhythm: Normal rate.  Pulmonary:  Effort: Pulmonary effort is normal.  Abdominal:     Palpations: Abdomen is soft.     Comments: Gravid   Genitourinary:    General: Normal vulva.  Musculoskeletal:        General: Normal range of motion.     Cervical back: Normal range of motion.  Skin:    General: Skin is warm and dry.  Neurological:     Mental Status: She is alert and oriented to person, place, and time.  Psychiatric:        Mood and Affect: Mood normal.        Behavior: Behavior normal.    Fetus A Non-Stress Test Interpretation for 07/19/23  Indication:  contractions  Fetal Heart Rate  A Mode: External Baseline Rate (A): 125 bpm Variability: Moderate Accelerations: 15 x 15 Decelerations: None  Uterine Activity Mode: Toco Contraction Frequency (min): 3-5 Contraction Duration (sec): 50-80 Contraction Quality: Moderate Resting Tone Palpated: Relaxed Resting Time: Adequate     Prenatal labs: ABO, Rh: A/Positive/-- (09/16 0849) Antibody: Negative (09/16 0849) Rubella: <0.90 (09/16 0849) RPR: Non Reactive (01/13 0920)  HBsAg: Negative (09/16 0849)  HIV: Non Reactive (01/13 0920)  GBS: Negative/-- (03/17 0859)   Assessment/Plan: -28 year old G1P0 at 30w in early labor.  -Discussed laboring at home as Alexa Grant is low risk and desires low interventions vs being admitted to L&D. Alexa Grant elects to be admitted to L&D.  -Category I FHR -Intermittent monitoring -Expectant management; will recheck in around 4 hrs.  -Alexa Grant prefers to not have a saline lock; we discussed risk vs benefit; it is reasonable to forego SL at this time but she is amenable if necessary at any point.  -Birth plan reviewed and copy is at nursing station.  -T. Schermerhorn aware of admission to L&D.    Alexa Grant, SNM Alexa Grant M [facility] 07/19/2023, 5:15 AM

## 2023-07-20 ENCOUNTER — Other Ambulatory Visit: Payer: Self-pay | Admitting: Certified Nurse Midwife

## 2023-07-20 ENCOUNTER — Encounter: Payer: BC Managed Care – PPO | Admitting: Licensed Practical Nurse

## 2023-07-20 ENCOUNTER — Telehealth: Payer: Self-pay | Admitting: Certified Nurse Midwife

## 2023-07-20 LAB — CBC
HCT: 31.4 % — ABNORMAL LOW (ref 36.0–46.0)
Hemoglobin: 10.4 g/dL — ABNORMAL LOW (ref 12.0–15.0)
MCH: 28.7 pg (ref 26.0–34.0)
MCHC: 33.1 g/dL (ref 30.0–36.0)
MCV: 86.5 fL (ref 80.0–100.0)
Platelets: 122 10*3/uL — ABNORMAL LOW (ref 150–400)
RBC: 3.63 MIL/uL — ABNORMAL LOW (ref 3.87–5.11)
RDW: 14 % (ref 11.5–15.5)
WBC: 11.5 10*3/uL — ABNORMAL HIGH (ref 4.0–10.5)
nRBC: 0.2 % (ref 0.0–0.2)

## 2023-07-20 MED ORDER — LORATADINE 10 MG PO TABS
10.0000 mg | ORAL_TABLET | Freq: Every day | ORAL | Status: DC
Start: 2023-07-20 — End: 2023-07-20
  Administered 2023-07-20: 10 mg via ORAL
  Filled 2023-07-20: qty 1

## 2023-07-20 MED ORDER — NORETHINDRONE 0.35 MG PO TABS: 1.0000 | ORAL_TABLET | Freq: Every day | ORAL | 11 refills | Status: DC

## 2023-07-20 NOTE — Lactation Note (Signed)
 This note was copied from a baby's chart. Lactation Consultation Note  Patient Name: Alexa Grant ZOXWR'U Date: 07/20/2023 Age:26 hours Reason for consult: Follow-up assessment;Mother's request;Primapara;Term   Maternal Data Patient requested lactation to come back to the room to observe a feeding at the breast and assist w/ a pumping session.  Feeding Mother's Current Feeding Choice: Breast Milk  Infant completed the feeding session prior to [facility] walking into room.   Lactation Tools Discussed/Used Tools: Pump;Nipple Shields Nipple shield size: 20 (LC provided a new nipple shield. 24mm is probably to big.) Breast pump type: Double-Electric Breast Pump Pump Education: Setup, frequency, and cleaning;Milk Storage Reason for Pumping: Using a nipple shield Pumping frequency: 4-6x w/in a day. Pumped volume: 1 mL  Patient was able to pump about 1ml of colostrum.   LC also provided patient w/a smaller nipple shield, 20mm.    Interventions Interventions: Expressed milk;Coconut oil;Education  LC sized patients nipples for pumping at 18mm flanges.  During this time LC noticed that patient has damage at her nipples.  Nipples are red w/ compression strikes.  LC provided guidance on different ways of healing the nipples; coconut oil, lanolin, hydrogels, softshells, colostrum, and evening giving her nipples a break.  Discharge Discharge Education: Engorgement and breast care;Warning signs for feeding baby;Outpatient recommendation  Consult Status Consult Status: Complete Follow-up type: Call as needed    Alexa Grant 07/20/2023, 12:09 PM

## 2023-07-20 NOTE — Lactation Note (Signed)
 This note was copied from a baby's chart. Lactation Consultation Note  Patient Name: Girl Jyasia Markoff ZOXWR'U Date: 07/20/2023 Age:26 hours Reason for consult: Follow-up assessment;Primapara;Term   Maternal Data Follow up assessment w/ P1 patient and 23hr old baby girl.  Patient stated that feedings have been a lot better.  She was given a 24mm nipple shield last night and infant is staying on the breast longer, and she is also seeing colostrum in the nipple shield.  Feeding Mother's Current Feeding Choice: Breast Milk  Lactation Tools Discussed/Used LC provided a DEBP to pump after every other feeding because of use of the nipple shield.    Interventions Interventions: DEBP;Education;CDC milk storage guidelines  LC encouraged parents to call out at the next feeding so lactation can observe a feeding and help w/ a pump session.  Parents verbalized understanding. Discharge Discharge Education: Engorgement and breast care;Warning signs for feeding baby;Outpatient recommendation  Education on engorgement prevention/treatment was discussed as well as breastmilk storage guidelines.  LC provided patient with a handout on breastmilk storage guidelines from [facility]. [facility] [place] outpatient lactation services phone number written on the white board in the room.  Patient verbalized understanding.  LC also provided education from the postpartum book about warning signs to look for in a poor feeding.    Consult Status Consult Status: Complete Follow-up type: Call as needed    Yvette Rack Lyle Niblett 07/20/2023, 9:58 AM

## 2023-07-20 NOTE — Discharge Instructions (Signed)

## 2023-07-20 NOTE — Progress Notes (Signed)
 Patient discharged home with family. Discharge instructions, when to follow up, and prescriptions reviewed with patient. Patient verbalized understanding. Patient will be escorted out by auxiliary.

## 2023-07-20 NOTE — Telephone Encounter (Addendum)
 Contacted the patient via phone, the patient needs 2 week video visit , 6 week ppv with Shanda Bumps.  No answer, voicemail not set up

## 2023-07-21 NOTE — Telephone Encounter (Signed)
 Contacted the patient via phone, the patient confirmed scheduled visits.

## 2023-07-27 ENCOUNTER — Telehealth: Payer: Self-pay

## 2023-07-27 ENCOUNTER — Encounter

## 2023-07-27 NOTE — Telephone Encounter (Signed)
 Alexa Grant

## 2023-08-04 ENCOUNTER — Encounter: Payer: Self-pay | Admitting: Certified Nurse Midwife

## 2023-08-04 ENCOUNTER — Telehealth (INDEPENDENT_AMBULATORY_CARE_PROVIDER_SITE_OTHER): Admitting: Certified Nurse Midwife

## 2023-08-04 DIAGNOSIS — R102 Pelvic and perineal pain: Secondary | ICD-10-CM

## 2023-08-04 DIAGNOSIS — Z1332 Encounter for screening for maternal depression: Secondary | ICD-10-CM | POA: Diagnosis not present

## 2023-08-04 NOTE — Progress Notes (Signed)
   Virtual Visit via Video Note  I connected with Alexa Grant on 08/04/23 at  11:15 AM EDT by a video enabled telemedicine application and verified that I am speaking with the correct person using two identifiers.  Location: Patient: home Provider: [facility] office   I discussed the limitations of evaluation and management by telemedicine and the availability of in person appointments. The patient expressed understanding and agreed to proceed.    History of Present Illness:   Alexa Grant is a 28 y.o. G18P1001 female who presents for a 2 week televisit for mood check. She is 2 weeks postpartum following a spontaneous vaginal delivery.  The delivery was at 39 gestational weeks.  Postpartum course has been well so far. Baby girl Mallor is feeding by breast. Bleeding: almost stopped, no odor or clots. Had episode of increased pelvic pain/heaviness that has now resolved. Denies incontinence, some burning with urination in first few days but has resolved. Denies constipation or hemorrhoids. Pleased with care & birth experience. Postpartum depression screening: negative.  EDPS score is 7.      The following portions of the patient's history were reviewed and updated as appropriate: allergies, current medications, past family history, past medical history, past social history, past surgical history, and problem list.   Observations/Objective:   currently breastfeeding. Gen App: NAD Psych: normal speech, affect. Good mood.        08/04/2023   10:56 AM 07/20/2023    2:06 AM 12/10/2022    8:33 AM  Edinburgh Postnatal Depression Scale Screening Tool  I have been able to laugh and see the funny side of things. 0 0 0  I have looked forward with enjoyment to things. 0 0 0  I have blamed myself unnecessarily when things went wrong. 2 1 0  I have been anxious or worried for no good reason. 1 1 1   I have felt scared or panicky for no good reason. 1 0 0  Things have been getting on top of me. 2  1 1   I have been so unhappy that I have had difficulty sleeping. 0 0 0  I have felt sad or miserable. 1 0 0  I have been so unhappy that I have been crying. 0 0 0  The thought of harming myself has occurred to me. 0 0 0  Edinburgh Postnatal Depression Scale Total 7 3 2          Assessment and Plan:   1. Encounter for screening for maternal depression - Screening Negative today. Will rescreen at 6 week postpartum visit.   - Continue zoloft  at 100mg  daily    2. Postpartum state - Overall doing well. Continue routine postpartum home care.  - Requests pelvic floor PT referral, history of pelvic pain due to [facility] Danlos   3. Lactating mother - Continue feeding on demand  4. Contraception - micronor  prescribed prior to discharge from hospital   Follow Up Instructions:     I discussed the assessment and treatment plan with the patient. The patient was provided an opportunity to ask questions and all were answered. The patient agreed with the plan and demonstrated an understanding of the instructions.   The patient was advised to call back or seek an in-person evaluation if the symptoms worsen or if the condition fails to improve as anticipated.   Forestine Igo, CNM

## 2023-08-11 NOTE — Therapy (Addendum)
 OUTPATIENT PHYSICAL THERAPY FEMALE PELVIC EVALUATION   Patient Name: Alexa Grant MRN: [id number] DOB:Aug 17, 1995, 28 y.o., female Today's Date: 08/12/2023  END OF SESSION:  PT End of Session - 08/12/23 1014     Visit Number 1    Date for PT Re-Evaluation 02/11/24    Authorization Type [employer]    Authorization - Visit Number 1    Authorization - Number of Visits 30    PT Start Time 1015    PT Stop Time 1055    PT Time Calculation (min) 40 min    Activity Tolerance Patient tolerated treatment well    Behavior During Therapy WFL for tasks assessed/performed             Past Medical History:  Diagnosis Date   Biliary dyskinesia 01/25/2016   Family history of adverse reaction to anesthesia    dad gets sick with anesthesia   Gastric ulcer    GERD (gastroesophageal reflux disease)    takes Pantoprazole  daily   Headache    History of migraine    few yrs ago   Joint pain    PONV (postoperative nausea and vomiting)    Seasonal allergies    Tylenol  Sinus and Nasonex as needed   Vaccine for human papilloma virus (HPV) types 6, 11, 16, and 18 administered    Past Surgical History:  Procedure Laterality Date   CHOLECYSTECTOMY N/A 01/25/2016   Procedure: LAPAROSCOPIC CHOLECYSTECTOMY WITH INTRAOPERATIVE CHOLANGIOGRAM;  Surgeon: Boyce Byes, MD;  Location: [facility] OR;  Service: General;  Laterality: N/A;   COLONOSCOPY WITH ESOPHAGOGASTRODUODENOSCOPY (EGD)     HIP SURGERY Bilateral    labrum repair   HIP SURGERY Right 2023   remove hardware   SHOULDER SURGERY Bilateral    labrum repair   Patient Active Problem List   Diagnosis Date Noted   Postpartum care following vaginal delivery 07/19/2023   Vaginal delivery 07/19/2023   Rubella non-immune status, antepartum 03/02/2023   Low-lying placenta-resolved 03/02/2023   Ehlers-Danlos syndrome 02/02/2023   Supervision of high risk pregnancy, antepartum 12/10/2022   Anxiety and depression 05/13/2019   Biliary dyskinesia  01/25/2016    PCP: Sari Cunning, MD  REFERRING PROVIDER: Forestine Igo, CNM   REFERRING DIAG: R10.2 (ICD-10-CM) - Pelvic pain   THERAPY DIAG:  Muscle weakness (generalized)  Other lack of coordination  Rationale for Evaluation and Treatment: Rehabilitation  ONSET DATE: 07/19/23  SUBJECTIVE:  SUBJECTIVE STATEMENT: Vaginal birth on 07/19/23. I have a weak pelvic floor. Patient has had 10 hip surgeries.  Fluid intake: water  PAIN:  Are you having pain? No   PRECAUTIONS: None  RED FLAGS: None   WEIGHT BEARING RESTRICTIONS: No  FALLS:  Has patient fallen in last 6 months? No  OCCUPATION: occupational therapist  ACTIVITY LEVEL : moderate exercise prior to giving birth.   PLOF: Independent  PATIENT GOALS: feel like everything is stronger and reduce leakage  PERTINENT HISTORY:  Cholecystectomy; bilateral hip surgery for labrum repair; bilateral shoulder surgery for labrum repair; bilary dyskinesia; Elhers Danlos;  Sexual abuse: No  BOWEL MOVEMENT:no issues   URINATION: Pain with urination: No Fully empty bladder: No hard to relax to fully empty the bladder, sits and breaths to relax and sometimes nothing happens Stream: Strong Urgency: No Frequency: 1 time per hour Leakage: Urge to void, Coughing, Sneezing, Laughing, Exercise, and Bending forward Pads: Yes: changes everytime she urinates to keep things clean  INTERCOURSE: right now not able to have intercourse due to not cleared from doctor yet  Ability to have vaginal penetration Yes  Pain with intercourse: none, prior to intercourse DrynessNo Climax: not able to climax prior to giving birth [name] Scale: 0/3   PREGNANCY: Vaginal deliveries 1 Tearing No Episiotomy No Currently pregnant No  PROLAPSE: Pressure,  deep pressure since she gave birth   OBJECTIVE:  Note: Objective measures were completed at Evaluation unless otherwise noted.  DIAGNOSTIC FINDINGS:  none  PATIENT SURVEYS:  PFIQ-7: 28 UIQ-7: 33 COGNITION: Overall cognitive status: Within functional limits for tasks assessed     SENSATION: Light touch: Appears intact   POSTURE: bilateral knee caps go inward   LUMBARAROM/PROM:  A/PROM A/PROM  eval  Flexion full  Extension Decreased by 25%  Right lateral flexion full  Left lateral flexion full  Right rotation Decreased by 25%  Left rotation Decreased by 25%   (Blank rows = not tested)  LOWER EXTREMITY ROM: bilateral hip ROM is full   LOWER EXTREMITY MMT:  MMT Right eval Left eval  Hip flexion 4/5 4/5  Hip extension 4/5 4/5  Hip abduction 4/5 4/5   (Blank rows = not tested) PALPATION:   Pelvic Alignment: ASIS are equal  Abdominal: tenderness located throughout the abdomen, difficulty with opening her lower rib cage                External Perineal Exam: not assessed at this time                             Internal Pelvic Floor: not assessed at this time  Patient confirms identification and approves PT to assess internal pelvic floor and treatment No 3 weeks post birth vaginally. Will assess when she is > 6 weeks.   PELVIC MMT:   MMT eval  Vaginal   Internal Anal Sphincter   External Anal Sphincter   Puborectalis   Diastasis Recti 1 finger above the umbilicus and 1/2 finger below and has good tension  (Blank rows = not tested)          TODAY'S TREATMENT:  DATE: 08/12/23  EVAL Examination completed, findings reviewed, pt educated on POC, HEP, and female pelvic floor anatomy, reasoning with pelvic floor assessment internally with pt consent. Pt motivated to participate in PT and agreeable to attempt recommendations.      PATIENT EDUCATION:  08/12/23 Education details: Access Code: [email or web address] Person educated: Patient Education method: Explanation, Demonstration, Tactile cues, Verbal cues, and Handouts Education comprehension: verbalized understanding, returned demonstration, verbal cues required, tactile cues required, and needs further education  HOME EXERCISE PROGRAM: 08/12/23 Access Code: [email or web address] URL: [email or web address] Date: 08/12/2023 Prepared by: Marsha Skeen  Exercises - Diaphragmatic Breathing in Child's Pose with Pelvic Floor Relaxation  - 1 x daily - 7 x weekly - 1 sets - 2 reps - 30 sec hold - Cat Cow  - 1 x daily - 7 x weekly - 1 sets - 10 reps - Child's Pose with Sidebending  - 1 x daily - 7 x weekly - 1 sets - 2 reps - 30 sec hold - Sidelying Thoracic Rotation with Open Book  - 1 x daily - 7 x weekly - 1 sets - 10 reps - Hooklying Transversus Abdominis Palpation  - 1 x daily - 7 x weekly - 1 sets - 10 reps  ASSESSMENT:  CLINICAL IMPRESSION: Patient is a 28 y.o. female who was seen today for physical therapy evaluation and treatment for pelvic pain. Patient had a vaginal birth on 07/19/23 and is 3 weeks post partum. She is having urinary leakage with urge to void, coughing, sneezing, laughing, exercise, and bending forward. She has difficulty with emptying her bladder and at times urine will not come out. She has weakness in her hips. She has difficulty with opening her lower rib cage. She has tenderness located in the abdominal and low back area. She has decreased movement of lumbar with extension and rotation. Patient history includes 10 hip surgery's, pelvic pain and  Elhers Danlos. Patient will benefit from skilled therapy to improve strength and coordination to reduce urinary leakage and improve strength for the stability of the joints due to having Elhers Danlos.   OBJECTIVE IMPAIRMENTS: decreased activity tolerance, decreased coordination, decreased ROM, decreased  strength, and increased muscle spasms.   ACTIVITY LIMITATIONS: lifting, continence, and locomotion level  PARTICIPATION LIMITATIONS: meal prep, cleaning, laundry, shopping, and community activity  PERSONAL FACTORS: Time since onset of injury/illness/exacerbation and 1-2 comorbidities: Cholecystectomy; bilateral hip surgery for labrum repair; bilateral shoulder surgery for labrum repair; bilary dyskinesia; Elhers Danlos;  are also affecting patient's functional outcome.   REHAB POTENTIAL: Excellent  CLINICAL DECISION MAKING: Evolving/moderate complexity  EVALUATION COMPLEXITY: Moderate   GOALS: Goals reviewed with patient? Yes  SHORT TERM GOALS: Target date: 09/09/23  Patient is independent with rib stretches to improve diaphragmatic breathing and movements of the pelvic floor.  Baseline: Goal status: INITIAL  2.  Patient is able to engage the transverse abdominus in different positions to work on core strength.  Baseline:  Goal status: INITIAL  3.  Patient pelvic floor was assessed to see how the tissue moves and strength test.  Baseline:  Goal status: INITIAL  4.  Patient educated on correct body mechanics to take care of her infant to not strain the pelvic floor.  Baseline:  Goal status: INITIAL    LONG TERM GOALS: Target date: 02/11/24  Patient independent with advanced HEP to improve pelvic floor and core strength to take care of her child and reduce urinary leakage.  Baseline:  Goal status: INITIAL  2.  Patient is  able to contract her pelvic floor quickly to reduce urinary leakage with coughing, sneezing and laughing.  Baseline:  Goal status: INITIAL  3.  Patient has increased in pelvic floor strength to reduce her urinary leakage to no episodes with lifting so she is able to go back to the gym.  Baseline:  Goal status: INITIAL  4.  Patient is able to fully relax her pelvic floor so she is able to have the urge to urinate and sit on the commode with fully  emptying her bladder.  Baseline:  Goal status: INITIAL   PLAN:  PT FREQUENCY: 1-2x/week  PT DURATION: 6 months  PLANNED INTERVENTIONS: 97110-Therapeutic exercises, 97530- Therapeutic activity, 97112- Neuromuscular re-education, 97535- Self Care, 04540- Manual therapy, [phone]- Aquatic Therapy, Patient/Family education, Dry Needling, Cryotherapy, Moist heat, and Biofeedback  PLAN FOR NEXT SESSION: manual work to the back and abdomen to work on diaphragmatic breathing to get the full excursion of the pelvic floor, begin low level core exercises, lifting techniques   Marsha Skeen, PT 08/12/23 10:52 AM  PHYSICAL THERAPY DISCHARGE SUMMARY  Visits from Start of Care: 1  Current functional level related to goals / functional outcomes: See above. Patient requested to be discharged.    Remaining deficits: See above.    Education / Equipment: HEP   Patient agrees to discharge. Patient goals were not met. Patient is being discharged due to the patient's request. Thank you for the referral.   Marsha Skeen, PT 10/01/23 4:40 PM

## 2023-08-12 ENCOUNTER — Encounter: Payer: Self-pay | Admitting: Physical Therapy

## 2023-08-12 ENCOUNTER — Ambulatory Visit: Attending: Certified Nurse Midwife | Admitting: Physical Therapy

## 2023-08-12 ENCOUNTER — Other Ambulatory Visit: Payer: Self-pay

## 2023-08-12 DIAGNOSIS — R102 Pelvic and perineal pain: Secondary | ICD-10-CM | POA: Diagnosis not present

## 2023-08-12 DIAGNOSIS — R278 Other lack of coordination: Secondary | ICD-10-CM

## 2023-08-12 DIAGNOSIS — M6281 Muscle weakness (generalized): Secondary | ICD-10-CM

## 2023-09-02 ENCOUNTER — Ambulatory Visit (INDEPENDENT_AMBULATORY_CARE_PROVIDER_SITE_OTHER): Admitting: Certified Nurse Midwife

## 2023-09-02 ENCOUNTER — Encounter: Payer: Self-pay | Admitting: Certified Nurse Midwife

## 2023-09-02 DIAGNOSIS — Z1332 Encounter for screening for maternal depression: Secondary | ICD-10-CM

## 2023-09-02 NOTE — Progress Notes (Signed)
Post Partum Visit Note  Alexa Grant is a 28 y.o. [id number] female who presents for a postpartum visit. She is 6 weeks postpartum following a normal spontaneous vaginal delivery.  I have fully reviewed the prenatal and intrapartum course. The delivery was at 39 gestational weeks.  Anesthesia: none. Postpartum course has been complicated by stress incontinence. Baby girl Alexa Grant is doing well. Baby is feeding by breast. Bleeding no bleeding. Bowel function is normal. Bladder function: continues to have stress incontinence/leaking as well as concerns for retention, has seen PT for evaluation but no appointments were available until June. Patient is not sexually active. Contraception method is abstinence, plans to start POP. Postpartum depression screening: negative.   The pregnancy intention screening data noted above was reviewed. Potential methods of contraception were discussed. The patient elected to proceed with No data recorded.   Edinburgh Postnatal Depression Scale - 09/02/23 1107       Edinburgh Postnatal Depression Scale:  In the Past 7 Days   I have been able to laugh and see the funny side of things. 0    I have looked forward with enjoyment to things. 0    I have blamed myself unnecessarily when things went wrong. 2    I have been anxious or worried for no good reason. 2    I have felt scared or panicky for no good reason. 0    Things have been getting on top of me. 2    I have been so unhappy that I have had difficulty sleeping. 0    I have felt sad or miserable. 1    I have been so unhappy that I have been crying. 0    The thought of harming myself has occurred to me. 0    Edinburgh Postnatal Depression Scale Total 7             Health Maintenance Due  Topic Date Due   HPV VACCINES (1 - 3-dose series) Never done   COVID-19 Vaccine (4 - 2024-25 season) 12/14/2022    The following portions of the patient's history were reviewed and updated as appropriate:  allergies, current medications, past family history, past medical history, past social history, past surgical history, and problem list.  Review of Systems Pertinent items are noted in HPI.  Objective:  BP 94/64 (BP Location: Right Arm, Patient Position: Sitting, Cuff Size: Normal)   Pulse 73   Ht 5\' 5"  (1.651 m)   Wt 163 lb 4.8 oz (74.1 kg)   Breastfeeding Yes   BMI 27.17 kg/m    General:  alert, cooperative, appears stated age, and no distress   Breasts:  Lactating, soft, nipples everted & intact  Lungs: clear to auscultation bilaterally  Heart:  regular rate and rhythm, S1, S2 normal, no murmur, click, rub or gallop  Abdomen: Soft, nontender, no DR        Assessment:    1. Routine postpartum follow-up   2. Postpartum care and examination of lactating mother   3. Encounter for screening for maternal depression    Normal postpartum exam.   Plan:   Essential components of care per ACOG recommendations:  1.  Mood and well being: Patient with negative depression screening today.  - Patient tobacco use? No.   - hx of drug use? No.    2. Infant care and feeding:  -Patient currently breastmilk feeding? Yes. Discussed returning to work and pumping.  -Social determinants of health (SDOH) reviewed in EPIC.  No concerns  3. Sexuality, contraception and birth spacing - Patient does not want a pregnancy in the next year.  Desired family size is 2-3 children.  - Reviewed reproductive life planning. Reviewed contraceptive methods based on pt preferences and effectiveness.  Patient desired Oral Contraceptive today.   - Discussed birth spacing of 18 months  4. Sleep and fatigue -Encouraged family/partner/community support of 4 hrs of uninterrupted sleep to help with mood and fatigue  5. Physical Recovery  - Discussed patients delivery and complications. She describes her labor as good. - Patient had a Vaginal, no problems at delivery. Patient had no laceration. Perineal  healing reviewed. Patient expressed understanding - Patient has urinary incontinence? Yes. Offered PT and patient accepted. Patient was referred to pelvic floor PT.  - Patient is safe to resume physical and sexual activity  6.  Health Maintenance - HM due items addressed Yes - Last pap smear  Diagnosis  Date Value Ref Range Status  01/05/2023   Final   - Negative for intraepithelial lesion or malignancy (NILM)   Pap smear not done at today's visit.  -Breast Cancer screening indicated? No.   7. Chronic Disease/Pregnancy Condition follow up: None  - PCP follow up  Alexa Grant, CNM [facility], [facility]

## 2023-09-07 ENCOUNTER — Encounter: Payer: Self-pay | Admitting: Certified Nurse Midwife

## 2023-09-22 ENCOUNTER — Telehealth: Payer: Self-pay

## 2023-09-22 DIAGNOSIS — N61 Mastitis without abscess: Secondary | ICD-10-CM

## 2023-09-22 MED ORDER — CEPHALEXIN 500 MG PO CAPS: 500.0000 mg | ORAL_CAPSULE | Freq: Four times a day (QID) | ORAL | 0 refills | Status: DC

## 2023-09-22 NOTE — Telephone Encounter (Signed)
 Patient contacted office to report that she has had pain, redness and tenderness of left breast for 24+hrs, patient states that breast appears to be larger than other and reports fever of 100, moderate pain and feeling of light headiness. Advised to patient that she continue to feed on baby's schedule, ensure proper latching, continue to feed on both breast, tylenol /ibuprofen PRN for fever or pain reliever and increase fluids. Advised patient that I will start her on Cephalexin 500 mg instead of Dicloxacillin since it has a interaction with Micronor , patient advised that if symptoms do not start to improve over the next 48hrs or get worse  contact office to be seen in person for evaluation. Patient verbalized understanding. KW

## 2023-09-23 ENCOUNTER — Other Ambulatory Visit: Payer: Self-pay

## 2023-09-23 MED ORDER — FLUCONAZOLE 150 MG PO TABS
150.0000 mg | ORAL_TABLET | Freq: Once | ORAL | 0 refills | Status: AC
Start: 2023-09-23 — End: 2023-09-23

## 2023-10-05 ENCOUNTER — Ambulatory Visit: Admitting: Physical Therapy

## 2023-10-30 ENCOUNTER — Encounter: Admitting: Physical Therapy

## 2023-11-04 ENCOUNTER — Encounter: Admitting: Physical Therapy

## 2023-11-11 ENCOUNTER — Encounter: Admitting: Physical Therapy

## 2023-11-18 ENCOUNTER — Encounter: Admitting: Physical Therapy

## 2023-11-25 ENCOUNTER — Encounter: Admitting: Physical Therapy

## 2023-11-25 ENCOUNTER — Other Ambulatory Visit: Payer: Self-pay

## 2023-11-25 ENCOUNTER — Encounter: Payer: Self-pay | Admitting: Certified Nurse Midwife

## 2023-11-25 DIAGNOSIS — R102 Pelvic and perineal pain: Secondary | ICD-10-CM

## 2023-12-08 ENCOUNTER — Other Ambulatory Visit: Payer: Self-pay

## 2023-12-08 ENCOUNTER — Ambulatory Visit: Payer: PRIVATE HEALTH INSURANCE | Attending: Certified Nurse Midwife

## 2023-12-08 DIAGNOSIS — R2689 Other abnormalities of gait and mobility: Secondary | ICD-10-CM | POA: Insufficient documentation

## 2023-12-08 DIAGNOSIS — N393 Stress incontinence (female) (male): Secondary | ICD-10-CM | POA: Insufficient documentation

## 2023-12-08 DIAGNOSIS — R278 Other lack of coordination: Secondary | ICD-10-CM | POA: Diagnosis present

## 2023-12-08 DIAGNOSIS — R102 Pelvic and perineal pain: Secondary | ICD-10-CM | POA: Diagnosis present

## 2023-12-08 DIAGNOSIS — M6281 Muscle weakness (generalized): Secondary | ICD-10-CM | POA: Diagnosis present

## 2023-12-08 NOTE — Patient Instructions (Signed)

## 2023-12-08 NOTE — Therapy (Addendum)
 OUTPATIENT PHYSICAL THERAPY FEMALE PELVIC EVALUATION   Patient Name: Alexa Grant MRN: [id number] DOB:02/07/1996, 28 y.o., female Today's Date: 12/08/2023  END OF SESSION:  PT End of Session - 12/08/23 1316     Visit Number 1    Number of Visits 9    Date for PT Re-Evaluation 02/06/24    Authorization Type [employer]    Progress Note Due on Visit 10    PT Start Time 1308    PT Stop Time 1348    PT Time Calculation (min) 40 min    Activity Tolerance Patient tolerated treatment well    Behavior During Therapy WFL for tasks assessed/performed          Past Medical History:  Diagnosis Date   Biliary dyskinesia 01/25/2016   Family history of adverse reaction to anesthesia    dad gets sick with anesthesia   Gastric ulcer    GERD (gastroesophageal reflux disease)    takes Pantoprazole  daily   Headache    History of migraine    few yrs ago   Joint pain    PONV (postoperative nausea and vomiting)    Seasonal allergies    Tylenol  Sinus and Nasonex as needed   Vaccine for human papilloma virus (HPV) types 6, 11, 16, and 18 administered    Past Surgical History:  Procedure Laterality Date   CHOLECYSTECTOMY N/A 01/25/2016   Procedure: LAPAROSCOPIC CHOLECYSTECTOMY WITH INTRAOPERATIVE CHOLANGIOGRAM;  Surgeon: [name], MD;  Location: [facility] OR;  Service: General;  Laterality: N/A;   COLONOSCOPY WITH ESOPHAGOGASTRODUODENOSCOPY (EGD)     HIP SURGERY Bilateral    labrum repair   HIP SURGERY Right 2023   remove hardware   SHOULDER SURGERY Bilateral    labrum repair   Patient Active Problem List   Diagnosis Date Noted   Postpartum care following vaginal delivery 07/19/2023   Vaginal delivery 07/19/2023   Rubella non-immune status, antepartum 03/02/2023   Low-lying placenta-resolved 03/02/2023   Ehlers-Danlos syndrome 02/02/2023   Supervision of high risk pregnancy, antepartum 12/10/2022   Anxiety and depression 05/13/2019   Biliary dyskinesia 01/25/2016    PCP: Dr.  [name]  REFERRING PROVIDER: [name], CNM  REFERRING DIAG: 11/25/23  THERAPY DIAG:  Muscle weakness (generalized)  Other lack of coordination  Pelvic pain  Other abnormalities of gait and mobility  Urinary, incontinence, stress female  Rationale for Evaluation and Treatment: Rehabilitation  ONSET DATE: 11/25/23: referral date, but onset since childhood but postpartum incr. S/s (07/19/23)  SUBJECTIVE:  SUBJECTIVE STATEMENT: URINARY FUNCTION: pt voids approx. Once an hour when drinking more water, on average every three hours. Pt has some pain when she voids, feels like she needs to go but doesn't always void when she gets to the toilet-not fully emptying. Pt has hx of yeast infections. Pt states stream is strong. Pt had epidurals and experienced urinary retention afterwards and it never fully recovered. SUI intermittently, before pregnancy (1-2x/time) and afterwards it was worse and now 1-2x/day again. Pt is breastfeeding baby, so she voids when she gets up at night. Hx of getting up about once/night prior to pregnancy. Pt used to wear pads pre-pregnancy but not recently-more of a dribble vs. Gush when leaking.  BOWEL FUNCTION: typically once a day and type 3-4 about 75% of the time. Pt denied pain or hemorrhoids. No pressure or bulging.  CORE STABILITY: hx of chronic SIJ pain, hx of cholecystectomy in 2017, no MVAs or falls. Pt states her core feels weak postpartum, vaginal delivery. No tearing.  SEXUAL FUNCTION: pt reported pain with intercourse, initial and deep penetration. Stops intercourse from happening. Pt reported she does not feel that she has ever climaxed. She hasn't had pain during OBGYN exams in the past or tampon insertion, but hasn't attempted either postpartum.   Fluid intake:  water in the am, Dr. Nunzio (one can/day), liquid IV with water, water (approx. 64 Oz.), sweet tea intermittently, no alcohol   PAIN:  Are you having pain? No NPRS scale: 0/10 Pelvic pain during intercourse: 6-8/10 with initial and deep intercourse and then achy for the next few hours.    PRECAUTIONS: None  RED FLAGS: None   WEIGHT BEARING RESTRICTIONS: No  FALLS:  Has patient fallen in last 6 months? No  OCCUPATION: occupational therapist-full-time   ACTIVITY LEVEL : on her feet all day at work, has not started a workout plan. Walks every night pushing stroller about 0.5 mile (at least).    PLOF: Independent  PATIENT GOALS: Feel like she has more control of pelvic floor, stop pain during intercourse, and stop leakage (less important).  PERTINENT HISTORY:  Ehlers-danlos syndrome, anxiety and depression, 10 joint surgeries (last one in 2023 2/2 ED), hx of migraine, seasonal allergies, joint pain Sexual abuse: No  BOWEL MOVEMENT: Pain with bowel movement: No Type of bowel movement:Frequency once daily Fully empty rectum: No Leakage: No Pads: No Fiber supplement/laxative No  URINATION: Pain with urination: Yes Fully empty bladder: Nopainful when she doesn't fully empty Stream: Strong Urgency: Yes  Frequency: once an hour to every three hours Leakage: Urge to void, Coughing, Sneezing, Laughing, and Lifting Pads: No but she used to wear pads  INTERCOURSE:  Ability to have vaginal penetration Yes  Pain with intercourse: Initial Penetration, During Penetration, Deep Penetration, After Intercourse, and Pain Interrupts Intercourse DrynessYes  Climax: no [name] Scale: 3/3   PREGNANCY: Vaginal deliveries 1 Tearing No Episiotomy No C-section deliveries 0 Currently pregnant No  PROLAPSE: None But felt pressure postpartum  OBJECTIVE:  Note: Objective measures were completed at Evaluation unless otherwise noted.   COGNITION: Overall cognitive status: Within  functional limits for tasks assessed     SENSATION: Light touch: Appears intact   GAIT: Assistive device utilized: None Comments: decr. Trunk rot  POSTURE: convex Grant tx spine curve, FHP, pt reported anterior pelvic tilt when carrying baby but was more neutral today incr. Postural sway during R and Grant SLS.   LUMBARAROM/PROM: WNL, Grant rot and sidebending incr. LBP and with ext  A/PROM  A/PROM  eval  Flexion   Extension   Right lateral flexion   Left lateral flexion   Right rotation   Left rotation    (Blank rows = not tested)  LOWER EXTREMITY ROM:  Active ROM Right eval Left eval  Hip flexion    Hip extension    Hip abduction    Hip adduction    Hip internal rotation    Hip external rotation    Knee flexion    Knee extension    Ankle dorsiflexion    Ankle plantarflexion    Ankle inversion    Ankle eversion     (Blank rows = not tested)  LOWER EXTREMITY MMT:  MMT Right eval Left eval  Hip flexion    Hip extension    Hip abduction    Hip adduction    Hip internal rotation    Hip external rotation    Knee flexion    Knee extension    Ankle dorsiflexion    Ankle plantarflexion    Ankle inversion    Ankle eversion     (Blank rows = not tested) PALPATION:  PELVIC MMT:   MMT eval  Vaginal   Internal Anal Sphincter   External Anal Sphincter   Puborectalis   Diastasis Recti   (Blank rows = not tested)        TONE: limited by time constraints   PROLAPSE: limited by time constraints   TODAY'S TREATMENT:                                                                                                                              DATE: 12/08/23  EVAL   SELF CARE: PATIENT EDUCATION:  Education details: PT educated pt on main functions of the pelvic floor, IAP, breath and PFM relationship. PT discussed POC, frequency and duration. PT provided the following education: TOILET POSTURE: Urination: feet flat, lean forward with forearms on legs to fully  empty bladder. Bowel movement: place feet flat on Squatty Potty or stool so knees are higher than hips, lean forward to relax pelvic floor in order to avoid strain.  SHOES: wear supportive shoes, and sandals with straps.  POSTURE: try not to cross legs at knees or ankles. Try the figure four stretch instead.  WATER: start with water first thing in the morning.   PELVIC TILTS: try to stand in neutral, not tucking your tail and not arching back, but in the middle. Also educated pt on having her husband push the stroller for now on evening walks until IAP is managed well. PT also educated pt on proper posture when lifting baby and standing posture to decr. LBP. Person educated: Patient Education method: Explanation, Demonstration, and Handouts Education comprehension: verbalized understanding and needs further education  HOME EXERCISE PROGRAM: Not yet established.  ASSESSMENT:  CLINICAL IMPRESSION: Patient is a pleasant 28 y.o. female who was seen today for physical therapy evaluation and treatment for pelvic pain, core  weakness, SIJ pain and SUI.  Pt's PMH is significant for the following: Ehlers-danlos syndrome, anxiety and depression, 10 joint surgeries (last one in 2023 2/2 ED), hx of migraine, seasonal allergies, joint pain. The following impairments were noted upon exam: back/SIJ pain, postural dysfunction, decr. Strength likely 2/2 subjective reports and gait deviations, impaired SLS balance, pelvic pain, SUI. Pt would benefit from skilled PT to improve safety and decr. Pain during all ADLs.    OBJECTIVE IMPAIRMENTS: Abnormal gait, decreased balance, decreased coordination, decreased strength, improper body mechanics, postural dysfunction, and pain.   ACTIVITY LIMITATIONS: carrying, lifting, bending, standing, squatting, continence, toileting, locomotion level, and caring for others  PARTICIPATION LIMITATIONS: meal prep, cleaning, laundry, interpersonal relationship, and  occupation  PERSONAL FACTORS: Past/current experiences, Profession, and 3+ comorbidities: see above are also affecting patient's functional outcome.   REHAB POTENTIAL: Good  CLINICAL DECISION MAKING: Evolving/moderate complexity  EVALUATION COMPLEXITY: Moderate   GOALS: Goals reviewed with patient? Yes  SHORT TERM GOALS: Target date: for all STGS: 01/05/24  Pt will be IND in HEP to improve pain, strength, coordination. Baseline: no HEP Goal status: INITIAL  2.  Finish exam and write goals as indicated. Baseline: limited by time constraints Goal status: INITIAL  3.  Pt will demo proper toileting posture to fully empty bladder. Baseline: unable to demo Goal status: INITIAL  4.  Pt will demonstrated improved relaxation and contraction of PFM with coordination of breath to reduce urinary leakage to </=three/week. Baseline: 1-2x/day Goal status: INITIAL  5.  Pt will demonstrate improved relaxation and contraction of pelvic floor muscles (PFM) with coordination of breath to decr. Pain to </=5/10 with intercourse with spouse. Baseline: 6-8/10 and stops intercourse Goal status: INITIAL   LONG TERM GOALS: Target date: 02/02/24   Pt will demonstrate proper lifting technique and biomechanics when carrying baby, pushing stroller and performing ADLs to decr. Pain. Baseline: unable to demo, incr. APT per pt when lifting baby Goal status: INITIAL  2.  Pt will demonstrate improved relaxation and contraction of pelvic floor muscles (PFM) with coordination of breath to decr. Pain to </=1/10 with intercourse with spouse. Baseline: 6-8/10 and stops intercourse Goal status: INITIAL  3.  Pt will demonstrated improved relaxation and contraction of PFM with coordination of breath to reduce urinary leakage to </=once/week. Baseline: 1-2x/day Goal status: INITIAL   PLAN: finish exam (palpation, ROM, MMT, DR.) Establish HEP.   PT FREQUENCY: 1x/week  PT DURATION: 8 weeks  PLANNED  INTERVENTIONS: 97164- PT Re-evaluation, 97110-Therapeutic exercises, 97530- Therapeutic activity, 97112- Neuromuscular re-education, 97535- Self Care, [id number]- Manual therapy, [phone]- Gait training, [phone] (1-2 muscles), [phone] (3+ muscles)- Dry Needling, Patient/Family education, Balance training, Taping, Joint mobilization, Spinal mobilization, Scar mobilization, Cryotherapy, Moist heat, and Biofeedback    Alexa Grant, PT 12/08/2023, 1:18 PM  Alexa Grant, PT,DPT 12/08/23 1:18 PM Phone: [phone] Fax: [phone]

## 2023-12-11 ENCOUNTER — Encounter: Payer: Self-pay | Admitting: Certified Nurse Midwife

## 2023-12-15 ENCOUNTER — Other Ambulatory Visit: Payer: Self-pay

## 2023-12-15 ENCOUNTER — Ambulatory Visit: Attending: Certified Nurse Midwife

## 2023-12-15 DIAGNOSIS — N393 Stress incontinence (female) (male): Secondary | ICD-10-CM | POA: Diagnosis present

## 2023-12-15 DIAGNOSIS — M6281 Muscle weakness (generalized): Secondary | ICD-10-CM | POA: Diagnosis present

## 2023-12-15 DIAGNOSIS — R2689 Other abnormalities of gait and mobility: Secondary | ICD-10-CM | POA: Insufficient documentation

## 2023-12-15 DIAGNOSIS — R102 Pelvic and perineal pain: Secondary | ICD-10-CM | POA: Insufficient documentation

## 2023-12-15 DIAGNOSIS — R278 Other lack of coordination: Secondary | ICD-10-CM | POA: Insufficient documentation

## 2023-12-15 NOTE — Therapy (Signed)
 OUTPATIENT PHYSICAL THERAPY FEMALE PELVIC TREATMENT   Patient Name: Alexa Grant MRN: [id number] DOB:10-01-1995, 28 y.o., female Today's Date: 12/15/2023  END OF SESSION:  PT End of Session - 12/15/23 1021     Visit Number 2    Number of Visits 9    Date for PT Re-Evaluation 02/06/24    Authorization Type [employer]    Progress Note Due on Visit 10    PT Start Time 1018    PT Stop Time 1111    PT Time Calculation (min) 53 min    Activity Tolerance Patient tolerated treatment well    Behavior During Therapy WFL for tasks assessed/performed          Past Medical History:  Diagnosis Date   Biliary dyskinesia 01/25/2016   Family history of adverse reaction to anesthesia    dad gets sick with anesthesia   Gastric ulcer    GERD (gastroesophageal reflux disease)    takes Pantoprazole  daily   Headache    History of migraine    few yrs ago   Joint pain    PONV (postoperative nausea and vomiting)    Seasonal allergies    Tylenol  Sinus and Nasonex as needed   Vaccine for human papilloma virus (HPV) types 6, 11, 16, and 18 administered    Past Surgical History:  Procedure Laterality Date   CHOLECYSTECTOMY N/A 01/25/2016   Procedure: LAPAROSCOPIC CHOLECYSTECTOMY WITH INTRAOPERATIVE CHOLANGIOGRAM;  Surgeon: [name], MD;  Location: [facility] OR;  Service: General;  Laterality: N/A;   COLONOSCOPY WITH ESOPHAGOGASTRODUODENOSCOPY (EGD)     HIP SURGERY Bilateral    labrum repair   HIP SURGERY Right 2023   remove hardware   SHOULDER SURGERY Bilateral    labrum repair   Patient Active Problem List   Diagnosis Date Noted   Postpartum care following vaginal delivery 07/19/2023   Vaginal delivery 07/19/2023   Rubella non-immune status, antepartum 03/02/2023   Low-lying placenta-resolved 03/02/2023   Ehlers-Danlos syndrome 02/02/2023   Supervision of high risk pregnancy, antepartum 12/10/2022   Anxiety and depression 05/13/2019   Biliary dyskinesia 01/25/2016    PCP: Dr. Oneil Pinal  REFERRING PROVIDER: [name], CNM  REFERRING DIAG: 11/25/23  THERAPY DIAG:  Muscle weakness (generalized)  Other lack of coordination  Pelvic pain  Other abnormalities of gait and mobility  Urinary, incontinence, stress female  Rationale for Evaluation and Treatment: Rehabilitation  ONSET DATE: 11/25/23: referral date, but onset since childhood but postpartum incr. S/s (07/19/23)  SUBJECTIVE:  SUBJECTIVE STATEMENT: Pt reported she did notice she has intermittent LBP (achy), at worst: 4/10, at best: 0/10. Go into posterior pelvic tilt vs. Ant. When holding her baby, no incontinence (bowels) but does have difficulty not passing gas. Pt has been practicing proper toileting posture and it helps.  EVAL: URINARY FUNCTION: pt voids approx. Once an hour when drinking more water, on average every three hours. Pt has some pain when she voids, feels like she needs to go but doesn't always void when she gets to the toilet-not fully emptying. Pt has hx of yeast infections. Pt states stream is strong. Pt had epidurals and experienced urinary retention afterwards and it never fully recovered. SUI intermittently, before pregnancy (1-2x/time) and afterwards it was worse and now 1-2x/day again. Pt is breastfeeding baby, so she voids when she gets up at night. Hx of getting up about once/night prior to pregnancy. Pt used to wear pads pre-pregnancy but not recently-more of a dribble vs. Gush when leaking.  BOWEL FUNCTION: typically once a day and type 3-4 about 75% of the time. Pt denied pain or hemorrhoids. No pressure or bulging.  CORE STABILITY: hx of chronic SIJ pain, hx of cholecystectomy in 2017, no MVAs or falls. Pt states her core feels weak postpartum, vaginal delivery. No tearing.  SEXUAL FUNCTION: pt  reported pain with intercourse, initial and deep penetration. Stops intercourse from happening. Pt reported she does not feel that she has ever climaxed. She hasn't had pain during OBGYN exams in the past or tampon insertion, but hasn't attempted either postpartum.   Fluid intake: water in the am, Dr. Nunzio (one can/day), liquid IV with water, water (approx. 64 Oz.), sweet tea intermittently, no alcohol   PAIN:  Are you having pain? Yes 12/15/23 NPRS scale:1/10 Back pain Better: repositioning while lying down and sometimes when she lies down it hurts even worse before it settles down.  Aggravating factors: holding her baby  EVAL. Pelvic pain during intercourse: 6-8/10 with initial and deep intercourse and then achy for the next few hours.    PRECAUTIONS: None  RED FLAGS: None   WEIGHT BEARING RESTRICTIONS: No  FALLS:  Has patient fallen in last 6 months? No  OCCUPATION: occupational therapist-full-time   ACTIVITY LEVEL : on her feet all day at work, has not started a workout plan. Walks every night pushing stroller about 0.5 mile (at least).    PLOF: Independent  PATIENT GOALS: Feel like she has more control of pelvic floor, stop pain during intercourse, and stop leakage (less important).  PERTINENT HISTORY:  Ehlers-danlos syndrome, anxiety and depression, 10 joint surgeries (last one in 2023 2/2 ED), hx of migraine, seasonal allergies, joint pain Sexual abuse: No  BOWEL MOVEMENT: Pain with bowel movement: No Type of bowel movement:Frequency once daily Fully empty rectum: No Leakage: No Pads: No Fiber supplement/laxative No  URINATION: Pain with urination: Yes Fully empty bladder: Nopainful when she doesn't fully empty Stream: Strong Urgency: Yes  Frequency: once an hour to every three hours Leakage: Urge to void, Coughing, Sneezing, Laughing, and Lifting Pads: No but she used to wear pads  INTERCOURSE:  Ability to have vaginal penetration Yes  Pain with  intercourse: Initial Penetration, During Penetration, Deep Penetration, After Intercourse, and Pain Interrupts Intercourse DrynessYes  Climax: no [name] Scale: 3/3   PREGNANCY: Vaginal deliveries 1 Tearing No Episiotomy No C-section deliveries 0 Currently pregnant No  PROLAPSE: None But felt pressure postpartum  OBJECTIVE:  Note: Objective measures were completed at Evaluation unless  otherwise noted.   COGNITION: Overall cognitive status: Within functional limits for tasks assessed     SENSATION: Light touch: Appears intact   GAIT: Assistive device utilized: None Comments: decr. Trunk rot  POSTURE: convex L tx spine curve, FHP, pt reported anterior pelvic tilt when carrying baby but was more neutral today incr. Postural sway during R and L SLS.   LUMBARAROM/PROM: WNL, L rot and sidebending incr. LBP and with ext  A/PROM A/PROM  eval  Flexion   Extension   Right lateral flexion   Left lateral flexion   Right rotation   Left rotation    (Blank rows = not tested)  LOWER EXTREMITY ROM: WFL  but B L IR and ER end range caused hip pain.  Active ROM Right eval Left eval  Hip flexion    Hip extension    Hip abduction    Hip adduction    Hip internal rotation    Hip external rotation    Knee flexion    Knee extension    Ankle dorsiflexion    Ankle plantarflexion    Ankle inversion    Ankle eversion     (Blank rows = not tested)  LOWER EXTREMITY MMT:  MMT Right eval Left eval  Hip flexion with pain in hip flexors 4- 4-  Hip extension    Hip abduction 3+ 3+  Hip adduction 4 3+  Hip internal rotation 4- 4-  Hip external rotation 4- 4-  Knee flexion 4- 4-  Knee extension 5 5  Ankle dorsiflexion 5 5  Ankle plantarflexion    Ankle inversion    Ankle eversion     (Blank rows = not tested) PALPATION: TTP over thoracolumbar junction, B glutes (R>L side), B hamstrings (lateral L and R but L pain worse), difficulty palpating for PFM externally, so  performed internal muscle assessment. Decr. Glute tension in R glute vs. L glute.  PELVIC MMT:   MMT eval  Vaginal   Internal Anal Sphincter   External Anal Sphincter   Puborectalis   Diastasis Recti   (Blank rows = not tested)        TONE: Decr. Glute tone, and incr. Tone in LLE   PROLAPSE: none   TODAY'S TREATMENT:                                                                                                                              DATE: 12/15/23   Physical function test: PT completed exam (palpation, MMT, DR, ROM). See above for details. 2.5 fingers width DR at umbilicus but none noted above and below umbilicus.   NMR: Access Code: [email or web address] URL: [email or web address] Date: 12/15/2023 Prepared by: Delon Pinal  Exercises - Supine Angels  - 1 x daily - 7 x weekly - 1 sets - 10 reps - Supine Diaphragmatic Breathing  - 1 x daily - 7 x weekly - 1 sets - 5 reps - Sidelying Open Book  -  1 x daily - 7 x weekly - 1 sets - 10 reps - Supine Butterfly Groin Stretch  - 1 x daily - 7 x weekly - 1 sets - 3 reps - 30-60 hold Cues and demo for proper technique and to coordinate breath with each movement. S for safety and     MANUAL THERAPY: Internal PFM assessment: pt agreeable to internal vaginal assessment. Pt performed diaphragmatic breathing to improve relaxation of PFM but pt had difficulty, 2/5 muscle strength with decr. Post. PFM activation noted, 1-2 sec. Hold before it dissipated during 10 sec. hold. Cues to improve PFM lengthening with inhalation vs. Contraction. After manual therapy: no pain afterwards but TTP over R OI, R coccygeus and L levator ani.   SELF CARE: PATIENT EDUCATION:  Education details: PT educated pt on exam findings, down regulating CNS to decr. PFM tension. Initiated HEP. Performed 5 minute peaceful guided meditation. Also, provided Patient Education on: - Holding and Carrying a Baby - Lifting and Lowering Baby to a Changing Table   Person educated: Patient Education method: Explanation, Demonstration, and Handouts Education comprehension: verbalized understanding and needs further education  HOME EXERCISE PROGRAM: [email or web address] medbridge.  ASSESSMENT:  CLINICAL IMPRESSION: Skilled session focused on completing exam, performing internal muscle assessment and initiating HEP. Pt also reported nearly constant LBP and difficulty coordinating PFM with breath (especially post. PFM). Pt progressed to performing HEP IND quickly. The following impairments continue to be noted upon exam: back/SIJ pain, postural dysfunction (more neutral to post. Pelvic tilt) , decr. Strength (especially hips and core) impaired SLS balance, pelvic pain, SUI. Pt would continue to benefit from skilled PT to improve safety and decr. Pain during all ADLs.    OBJECTIVE IMPAIRMENTS: Abnormal gait, decreased balance, decreased coordination, decreased strength, improper body mechanics, postural dysfunction, and pain.   ACTIVITY LIMITATIONS: carrying, lifting, bending, standing, squatting, continence, toileting, locomotion level, and caring for others  PARTICIPATION LIMITATIONS: meal prep, cleaning, laundry, interpersonal relationship, and occupation  PERSONAL FACTORS: Past/current experiences, Profession, and 3+ comorbidities: see above are also affecting patient's functional outcome.   REHAB POTENTIAL: Good  CLINICAL DECISION MAKING: Evolving/moderate complexity  EVALUATION COMPLEXITY: Moderate   GOALS: Goals reviewed with patient? Yes  SHORT TERM GOALS: Target date: for all STGS: 01/05/24  Pt will be IND in HEP to improve pain, strength, coordination. Baseline: no HEP Goal status: INITIAL  2.  Finish exam and write goals as indicated. Baseline: limited by time constraints Goal status: MET  3.  Pt will demo proper toileting posture to fully empty bladder. Baseline: unable to demo Goal status: INITIAL  4.  Pt will demonstrated improved  relaxation and contraction of PFM with coordination of breath to reduce urinary leakage to </=three/week. Baseline: 1-2x/day Goal status: INITIAL  5.  Pt will demonstrate improved relaxation and contraction of pelvic floor muscles (PFM) with coordination of breath to decr. Pain to </=5/10 with intercourse with spouse. Baseline: 6-8/10 and stops intercourse Goal status: INITIAL   LONG TERM GOALS: Target date: 02/02/24   Pt will demonstrate proper lifting technique and biomechanics when carrying baby, pushing stroller and performing ADLs to decr. Pain. Baseline: unable to demo, incr. APT per pt when lifting baby Goal status: INITIAL  2.  Pt will demonstrate improved relaxation and contraction of pelvic floor muscles (PFM) with coordination of breath to decr. Pain to </=1/10 with intercourse with spouse. Baseline: 6-8/10 and stops intercourse Goal status: INITIAL  3.  Pt will demonstrated improved relaxation and contraction of PFM with  coordination of breath to reduce urinary leakage to </=once/week. Baseline: 1-2x/day Goal status: INITIAL   PLAN: review HEP, add cat/cow and child's pose during DB, TrA activation and hip abd/add.   PT FREQUENCY: 1x/week  PT DURATION: 8 weeks  PLANNED INTERVENTIONS: 97164- PT Re-evaluation, 97110-Therapeutic exercises, 97530- Therapeutic activity, 97112- Neuromuscular re-education, 97535- Self Care, [id number]- Manual therapy, [phone]- Gait training, [phone] (1-2 muscles), 20561 (3+ muscles)- Dry Needling, Patient/Family education, Balance training, Taping, Joint mobilization, Spinal mobilization, Scar mobilization, Cryotherapy, Moist heat, and Biofeedback    Brighton Pilley L, PT 12/15/2023, 11:31 AM  Delon Pinal, PT,DPT 12/15/23 11:31 AM Phone: [phone] Fax: [phone]

## 2023-12-17 ENCOUNTER — Encounter: Payer: Self-pay | Admitting: Certified Nurse Midwife

## 2023-12-17 ENCOUNTER — Encounter: Payer: Self-pay | Admitting: Obstetrics

## 2023-12-17 ENCOUNTER — Ambulatory Visit: Admitting: Obstetrics

## 2023-12-17 VITALS — BP 118/81 | HR 74 | Wt 154.9 lb

## 2023-12-17 DIAGNOSIS — F430 Acute stress reaction: Secondary | ICD-10-CM

## 2023-12-17 DIAGNOSIS — F32A Depression, unspecified: Secondary | ICD-10-CM

## 2023-12-17 DIAGNOSIS — F419 Anxiety disorder, unspecified: Secondary | ICD-10-CM

## 2023-12-17 NOTE — Progress Notes (Unsigned)
 GYNECOLOGY PROGRESS NOTE  Subjective:  PCP: Cleotilde Oneil FALCON, MD  Patient ID: Alexa Grant, female    DOB: 1995/10/09, 28 y.o.   MRN: [id number]  HPI  Patient is a 28 y.o. G74P1001 female who presents for same-day visit, called with concerns about depression.  Patient delivered via SVD 5 months ago.  Has PMH of anxiety and depression and has been stable on Zoloft  100mg , took throughout pregnancy and still currently taking.  Has a therapist but has not seen them since delivery as she has been feeling good. Over the past few weeks, pt has started feeling more frustrated with her partner, not helping with baby or around the house. 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 Pt denies SI/HI and has not had any negative feelings toward baby. Her mom watches the baby during the day and she is good source of support. She recently went on a trip and contracted COVID, pt has been unable to see her mother and has been missing her. She has an upcoming appointment with her therapist on Tue 12/22/23.  The following portions of the patient's history were reviewed and updated as appropriate: allergies, current medications, past family history, past medical history, past social history, past surgical history, and problem list.  Review of Systems Pertinent items are noted in HPI.   Objective:   Blood pressure 118/81, pulse 74, weight 154 lb 14.4 oz (70.3 kg), currently breastfeeding. Body mass index is 25.78 kg/m.  General appearance: alert, cooperative, and sad Pelvic: deferred Extremities: extremities normal, atraumatic, no cyanosis or edema Neurologic: Grossly normal   Edinburgh Postnatal Depression Scale - 12/17/23 1559       Edinburgh Postnatal Depression Scale:  In the Past 7 Days   I have been able to laugh and see the funny side of things. 1    I have looked forward with enjoyment to things. 1    I have blamed myself unnecessarily when things went wrong. 3    I  have been anxious or worried for no good reason. 3    I have felt scared or panicky for no good reason. 3    Things have been getting on top of me. 3    I have been so unhappy that I have had difficulty sleeping. 2    I have felt sad or miserable. 2    I have been so unhappy that I have been crying. 0    The thought of harming myself has occurred to me. 1    Edinburgh Postnatal Depression Scale Total 19            Assessment/Plan:   1. Anxiety and depression   2. Acute reaction to situational stress    28 y.o. G1P1001 s/p SVD 07/19/23 with chronic anxiety and depression on Zoloft  100mg , stable until a few weeks ago. Long discussion regarding the external, environmental stressors in pt's life, mainly her partner who she feels like she needs more help from, and work, she desires to stay at home, but cannot financially. We went through scenarios of ideal circumstances and pt states she would feel a lot better if he started helping more and she could stay home. She emphatically denies SI/HI, she answered a 1 to the #10 question on EPDS and denies having an active plan or suicidal thoughts, it is more passive where she wishes she could get away from everything. Pt is not having any harmful thoughts toward her  baby, is not having mood swings, is able to get up daily and go to work. She has been lacking support from her mom while her mother has been away on vacation and then dx with COVID. I do not feel this is postpartum depression, but more stress and frustration from her circumstances. Asked pt if she thinks an increase in medication would help her situation, she actually feels it would not. She should continue her regular Zoloft  dose of 100mg . I suggested that perhaps she and her partner engage in couples therapy where she can have expert guidance help them navigate their new parenting roles and how to more evenly distribute the workload so each person feels supported. She agrees to discuss this with  her partner. She also recognizes that more medication will not take away her desire to quit her job, and if that is a source of great stress, then therapy can help her with coping techniques to better deal with her current situation. It is reassuring that pt has an upcoming therapy appointment on 12/22/23, she promises to go. In the meantime, we made a plan for the next few days until therapy to call hotline or 911, or straight to ED if feeling SI/HI in the slightest and she also agrees to this.  Total time was 50 minutes. That includes chart review before the visit, the actual patient visit, and time spent on documentation after the visit. Time excludes procedures, if any.    Estil Mangle, DO [facility]

## 2023-12-18 DIAGNOSIS — F430 Acute stress reaction: Secondary | ICD-10-CM | POA: Insufficient documentation

## 2023-12-21 ENCOUNTER — Ambulatory Visit

## 2023-12-21 ENCOUNTER — Other Ambulatory Visit: Payer: Self-pay

## 2023-12-21 DIAGNOSIS — N393 Stress incontinence (female) (male): Secondary | ICD-10-CM

## 2023-12-21 DIAGNOSIS — M6281 Muscle weakness (generalized): Secondary | ICD-10-CM | POA: Diagnosis not present

## 2023-12-21 DIAGNOSIS — R2689 Other abnormalities of gait and mobility: Secondary | ICD-10-CM

## 2023-12-21 DIAGNOSIS — R102 Pelvic and perineal pain: Secondary | ICD-10-CM

## 2023-12-21 DIAGNOSIS — R278 Other lack of coordination: Secondary | ICD-10-CM

## 2023-12-21 NOTE — Therapy (Signed)
 OUTPATIENT PHYSICAL THERAPY FEMALE PELVIC TREATMENT   Patient Name: Alexa Grant MRN: [id number] DOB:08/11/1995, 28 y.o., female Today's Date: 12/21/2023  END OF SESSION:  PT End of Session - 12/21/23 0801     Visit Number 3    Number of Visits 9    Date for PT Re-Evaluation 02/06/24    Authorization Type [employer]    Progress Note Due on Visit 10    PT Start Time 0800    PT Stop Time 0841    PT Time Calculation (min) 41 min    Activity Tolerance Patient tolerated treatment well    Behavior During Therapy [facility] for tasks assessed/performed          Past Medical History:  Diagnosis Date   Biliary dyskinesia 01/25/2016   Family history of adverse reaction to anesthesia    dad gets sick with anesthesia   Gastric ulcer    GERD (gastroesophageal reflux disease)    takes Pantoprazole  daily   Headache    History of migraine    few yrs ago   Joint pain    PONV (postoperative nausea and vomiting)    Seasonal allergies    Tylenol  Sinus and Nasonex as needed   Vaccine for human papilloma virus (HPV) types 6, 11, 16, and 18 administered    Past Surgical History:  Procedure Laterality Date   CHOLECYSTECTOMY N/A 01/25/2016   Procedure: LAPAROSCOPIC CHOLECYSTECTOMY WITH INTRAOPERATIVE CHOLANGIOGRAM;  Surgeon: [name], MD;  Location: [facility] OR;  Service: General;  Laterality: N/A;   COLONOSCOPY WITH ESOPHAGOGASTRODUODENOSCOPY (EGD)     HIP SURGERY Bilateral    labrum repair   HIP SURGERY Right 2023   remove hardware   SHOULDER SURGERY Bilateral    labrum repair   Patient Active Problem List   Diagnosis Date Noted   Acute reaction to situational stress 12/18/2023   Postpartum care following vaginal delivery 07/19/2023   Vaginal delivery 07/19/2023   Rubella non-immune status, antepartum 03/02/2023   Low-lying placenta-resolved 03/02/2023   Ehlers-Danlos syndrome 02/02/2023   Supervision of high risk pregnancy, antepartum 12/10/2022   Anxiety and depression 05/13/2019    Biliary dyskinesia 01/25/2016    PCP: Dr. Oneil Pinal  REFERRING PROVIDER: [name], CNM  REFERRING DIAG: 11/25/23  THERAPY DIAG:  Muscle weakness (generalized)  Other lack of coordination  Pelvic pain  Other abnormalities of gait and mobility  Urinary, incontinence, stress female  Rationale for Evaluation and Treatment: Rehabilitation  ONSET DATE: 11/25/23: referral date, but onset since childhood but postpartum incr. S/s (07/19/23)  SUBJECTIVE:  SUBJECTIVE STATEMENT: Pt reported HEP is going well, she took a mental health day on Friday as she's feeling depressed and saw her OBGYN and her therapist appt. Is tomorrow.    EVAL: URINARY FUNCTION: pt voids approx. Once an hour when drinking more water, on average every three hours. Pt has some pain when she voids, feels like she needs to go but doesn't always void when she gets to the toilet-not fully emptying. Pt has hx of yeast infections. Pt states stream is strong. Pt had epidurals and experienced urinary retention afterwards and it never fully recovered. SUI intermittently, before pregnancy (1-2x/time) and afterwards it was worse and now 1-2x/day again. Pt is breastfeeding baby, so she voids when she gets up at night. Hx of getting up about once/night prior to pregnancy. Pt used to wear pads pre-pregnancy but not recently-more of a dribble vs. Gush when leaking.  BOWEL FUNCTION: typically once a day and type 3-4 about 75% of the time. Pt denied pain or hemorrhoids. No pressure or bulging.  CORE STABILITY: hx of chronic SIJ pain, hx of cholecystectomy in 2017, no MVAs or falls. Pt states her core feels weak postpartum, vaginal delivery. No tearing.  SEXUAL FUNCTION: pt reported pain with intercourse, initial and deep penetration. Stops  intercourse from happening. Pt reported she does not feel that she has ever climaxed. She hasn't had pain during OBGYN exams in the past or tampon insertion, but hasn't attempted either postpartum.   Fluid intake: water in the am, Dr. Nunzio (one can/day), liquid IV with water, water (approx. 64 Oz.), sweet tea intermittently, no alcohol   PAIN:  Are you having pain? Yes 12/21/23 NPRS scale:1/10 Low Back pain Better: repositioning while lying down and sometimes when she lies down it hurts even worse before it settles down.  Aggravating factors: holding her baby  EVAL. Pelvic pain during intercourse: 6-8/10 with initial and deep intercourse and then achy for the next few hours.    PRECAUTIONS: None  RED FLAGS: None   WEIGHT BEARING RESTRICTIONS: No  FALLS:  Has patient fallen in last 6 months? No  OCCUPATION: occupational therapist-full-time   ACTIVITY LEVEL : on her feet all day at work, has not started a workout plan. Walks every night pushing stroller about 0.5 mile (at least).    PLOF: Independent  PATIENT GOALS: Feel like she has more control of pelvic floor, stop pain during intercourse, and stop leakage (less important).  PERTINENT HISTORY:  Ehlers-danlos syndrome, anxiety and depression, 10 joint surgeries (last one in 2023 2/2 ED), hx of migraine, seasonal allergies, joint pain Sexual abuse: No  BOWEL MOVEMENT: Pain with bowel movement: No Type of bowel movement:Frequency once daily Fully empty rectum: No Leakage: No Pads: No Fiber supplement/laxative No  URINATION: Pain with urination: Yes Fully empty bladder: Nopainful when she doesn't fully empty Stream: Strong Urgency: Yes  Frequency: once an hour to every three hours Leakage: Urge to void, Coughing, Sneezing, Laughing, and Lifting Pads: No but she used to wear pads  INTERCOURSE:  Ability to have vaginal penetration Yes  Pain with intercourse: Initial Penetration, During Penetration, Deep  Penetration, After Intercourse, and Pain Interrupts Intercourse DrynessYes  Climax: no [name] Scale: 3/3   PREGNANCY: Vaginal deliveries 1 Tearing No Episiotomy No C-section deliveries 0 Currently pregnant No  PROLAPSE: None But felt pressure postpartum  OBJECTIVE:  Note: Objective measures were completed at Evaluation unless otherwise noted.   COGNITION: Overall cognitive status: Within functional limits for tasks assessed  SENSATION: Light touch: Appears intact   GAIT: Assistive device utilized: None Comments: decr. Trunk rot  POSTURE: convex L tx spine curve, FHP, pt reported anterior pelvic tilt when carrying baby but was more neutral today incr. Postural sway during R and L SLS.   LUMBARAROM/PROM: WNL, L rot and sidebending incr. LBP and with ext  A/PROM A/PROM  eval  Flexion   Extension   Right lateral flexion   Left lateral flexion   Right rotation   Left rotation    (Blank rows = not tested)  LOWER EXTREMITY ROM: WFL  but B L IR and ER end range caused hip pain.  Active ROM Right eval Left eval  Hip flexion    Hip extension    Hip abduction    Hip adduction    Hip internal rotation    Hip external rotation    Knee flexion    Knee extension    Ankle dorsiflexion    Ankle plantarflexion    Ankle inversion    Ankle eversion     (Blank rows = not tested)  LOWER EXTREMITY MMT:  MMT Right eval Left eval  Hip flexion with pain in hip flexors 4- 4-  Hip extension    Hip abduction 3+ 3+  Hip adduction 4 3+  Hip internal rotation 4- 4-  Hip external rotation 4- 4-  Knee flexion 4- 4-  Knee extension 5 5  Ankle dorsiflexion 5 5  Ankle plantarflexion    Ankle inversion    Ankle eversion     (Blank rows = not tested) PALPATION: TTP over thoracolumbar junction, B glutes (R>L side), B hamstrings (lateral L and R but L pain worse), difficulty palpating for PFM externally, so performed internal muscle assessment. Decr. Glute tension in R  glute vs. L glute.  PELVIC MMT:   MMT eval  Vaginal   Internal Anal Sphincter   External Anal Sphincter   Puborectalis   Diastasis Recti   (Blank rows = not tested)        TONE: Decr. Glute tone, and incr. Tone in LLE   PROLAPSE: none   TODAY'S TREATMENT:                                                                                                                              DATE: 12/21/23    NMR: Access Code: [email or web address] URL: [email or web address] Date: 12/21/2023 Prepared by: Delon Pinal  Exercises: reviewed 2-5 reps of previous HEP to ensure proper technique. - Supine Angels  - 1 x daily - 7 x weekly - 1 sets - 10 reps - Supine Diaphragmatic Breathing  - 1 x daily - 7 x weekly - 1 sets - 5 reps - Sidelying Open Book  - 1 x daily - 7 x weekly - 1 sets - 10 reps - Supine Butterfly Groin Stretch  - 1 x daily - 7 x weekly - 1 sets - 3 reps - 30-60 hold  New HEP: - Diaphragmatic Breathing in Child's Pose with Pelvic Floor Relaxation  - 1 x daily - 7 x weekly - 1 sets - 5 reps - 30-60 hold - Cat Cow  - 1 x daily - 7 x weekly - 1 sets - 5 reps - Supine Pelvic Tilt  - 1 x daily - 7 x weekly - 1 sets - 10 reps - Hooklying Transversus Abdominis Palpation  - 1 x daily - 7 x weekly - 1 sets - 10 reps - Prone Gluteal Sets  - 1 x daily - 7 x weekly - 1 sets - 10 reps - Clamshell with Resistance  - 1 x daily - 3 x weekly - 3 sets - 10 reps - Beginner Inside Leg Lift   - 1 x daily - 7 x weekly - 3 sets - 10 reps 3+/5 B hip ext with hamstring firing first and primarily-required max cues to correct. Cues and demo for proper technique and to coordinate breath with each movement. S for safety. No pain.       SELF CARE: PATIENT EDUCATION:  Education details: PT progressed HEP and educated on foam rolling to activate glutes before exercise and afterwards-see pt instructions. PT reiterated the importance of mental health care and seeking MD advice. Person educated:  Patient Education method: Explanation, Demonstration, and Handouts Education comprehension: verbalized understanding and needs further education  HOME EXERCISE PROGRAM: [id number] medbridge.  ASSESSMENT:  CLINICAL IMPRESSION: Skilled session focused on progressing HEP to improve strength and mobility and posture. PT palpated glutes and hamstrings and found hamstrings fire first and primarily during B hip ext and pt required cues to improve glute max firing (B). The following impairments continue to be noted upon exam: back/SIJ pain, postural dysfunction (more neutral to post. Pelvic tilt) , decr. Strength (especially hips and core) impaired SLS balance, pelvic pain, SUI. Pt would continue to benefit from skilled PT to improve safety and decr. Pain during all ADLs.    OBJECTIVE IMPAIRMENTS: Abnormal gait, decreased balance, decreased coordination, decreased strength, improper body mechanics, postural dysfunction, and pain.   ACTIVITY LIMITATIONS: carrying, lifting, bending, standing, squatting, continence, toileting, locomotion level, and caring for others  PARTICIPATION LIMITATIONS: meal prep, cleaning, laundry, interpersonal relationship, and occupation  PERSONAL FACTORS: Past/current experiences, Profession, and 3+ comorbidities: see above are also affecting patient's functional outcome.   REHAB POTENTIAL: Good  CLINICAL DECISION MAKING: Evolving/moderate complexity  EVALUATION COMPLEXITY: Moderate   GOALS: Goals reviewed with patient? Yes  SHORT TERM GOALS: Target date: for all STGS: 01/05/24  Pt will be IND in HEP to improve pain, strength, coordination. Baseline: no HEP Goal status: INITIAL  2.  Finish exam and write goals as indicated. Baseline: limited by time constraints Goal status: MET  3.  Pt will demo proper toileting posture to fully empty bladder. Baseline: unable to demo Goal status: INITIAL  4.  Pt will demonstrated improved relaxation and contraction of PFM  with coordination of breath to reduce urinary leakage to </=three/week. Baseline: 1-2x/day Goal status: INITIAL  5.  Pt will demonstrate improved relaxation and contraction of pelvic floor muscles (PFM) with coordination of breath to decr. Pain to </=5/10 with intercourse with spouse. Baseline: 6-8/10 and stops intercourse Goal status: INITIAL   LONG TERM GOALS: Target date: 02/02/24   Pt will demonstrate proper lifting technique and biomechanics when carrying baby, pushing stroller and performing ADLs to decr. Pain. Baseline: unable to demo, incr. APT per pt when lifting baby Goal status: INITIAL  2.  Pt will demonstrate improved relaxation and contraction of pelvic floor muscles (PFM) with coordination of breath to decr. Pain to </=1/10 with intercourse with spouse. Baseline: 6-8/10 and stops intercourse Goal status: INITIAL  3.  Pt will demonstrated improved relaxation and contraction of PFM with coordination of breath to reduce urinary leakage to </=once/week. Baseline: 1-2x/day Goal status: INITIAL   PLAN: review HEP and scar mob.  PT FREQUENCY: 1x/week  PT DURATION: 8 weeks  PLANNED INTERVENTIONS: 97164- PT Re-evaluation, 97110-Therapeutic exercises, 97530- Therapeutic activity, 97112- Neuromuscular re-education, 97535- Self Care, [id number]- Manual therapy, [id number]- Gait training, [id number] (1-2 muscles), 20561 (3+ muscles)- Dry Needling, Patient/Family education, Balance training, Taping, Joint mobilization, Spinal mobilization, Scar mobilization, Cryotherapy, Moist heat, and Biofeedback    Dorine Duffey L, PT 12/21/2023, 8:02 AM  Delon Pinal, PT,DPT 12/21/23 8:02 AM Phone: [phone] Fax: [phone]

## 2023-12-21 NOTE — Patient Instructions (Signed)
 Foam roll: prior to glute exercises, foam roll glutes for 10 seconds to activate OR perform a 5-minute fast walk. After exercises, foam roll glutes for 30 seconds-avoid greater trochanter.

## 2023-12-26 ENCOUNTER — Other Ambulatory Visit: Payer: Self-pay | Admitting: Certified Nurse Midwife

## 2023-12-26 DIAGNOSIS — F32A Depression, unspecified: Secondary | ICD-10-CM

## 2023-12-28 ENCOUNTER — Ambulatory Visit

## 2023-12-28 ENCOUNTER — Other Ambulatory Visit: Payer: Self-pay

## 2023-12-28 DIAGNOSIS — N393 Stress incontinence (female) (male): Secondary | ICD-10-CM

## 2023-12-28 DIAGNOSIS — R2689 Other abnormalities of gait and mobility: Secondary | ICD-10-CM

## 2023-12-28 DIAGNOSIS — M6281 Muscle weakness (generalized): Secondary | ICD-10-CM

## 2023-12-28 DIAGNOSIS — R278 Other lack of coordination: Secondary | ICD-10-CM

## 2023-12-28 DIAGNOSIS — R102 Pelvic and perineal pain: Secondary | ICD-10-CM

## 2023-12-28 NOTE — Patient Instructions (Signed)
 SIJ: roll up a wash cloth, place it parallel to Right sacrum and lie on it for 5 minutes, daily for one week. The second week: perform the same thing every other day.

## 2023-12-28 NOTE — Therapy (Signed)
 OUTPATIENT PHYSICAL THERAPY FEMALE PELVIC TREATMENT   Patient Name: Alexa Grant MRN: [id number] DOB:01-02-1996, 28 y.o., female Today's Date: 12/28/2023  END OF SESSION:  PT End of Session - 12/28/23 0809     Visit Number 4    Number of Visits 9    Date for PT Re-Evaluation 02/06/24    Authorization Type BC[employer]    Progress Note Due on Visit 10    PT Start Time 0808   pt late   PT Stop Time 0840    PT Time Calculation (min) 32 min    Activity Tolerance Patient tolerated treatment well    Behavior During Therapy [facility] for tasks assessed/performed          Past Medical History:  Diagnosis Date   Biliary dyskinesia 01/25/2016   Family history of adverse reaction to anesthesia    dad gets sick with anesthesia   Gastric ulcer    GERD (gastroesophageal reflux disease)    takes Pantoprazole  daily   Headache    History of migraine    few yrs ago   Joint pain    PONV (postoperative nausea and vomiting)    Seasonal allergies    Tylenol  Sinus and Nasonex as needed   Vaccine for human papilloma virus (HPV) types 6, 11, 16, and 18 administered    Past Surgical History:  Procedure Laterality Date   CHOLECYSTECTOMY N/A 01/25/2016   Procedure: LAPAROSCOPIC CHOLECYSTECTOMY WITH INTRAOPERATIVE CHOLANGIOGRAM;  Surgeon: [name], MD;  Location: [facility] OR;  Service: General;  Laterality: N/A;   COLONOSCOPY WITH ESOPHAGOGASTRODUODENOSCOPY (EGD)     HIP SURGERY Bilateral    labrum repair   HIP SURGERY Right 2023   remove hardware   SHOULDER SURGERY Bilateral    labrum repair   Patient Active Problem List   Diagnosis Date Noted   Acute reaction to situational stress 12/18/2023   Postpartum care following vaginal delivery 07/19/2023   Vaginal delivery 07/19/2023   Rubella non-immune status, antepartum 03/02/2023   Low-lying placenta-resolved 03/02/2023   Ehlers-Danlos syndrome 02/02/2023   Supervision of high risk pregnancy, antepartum 12/10/2022   Anxiety and depression  05/13/2019   Biliary dyskinesia 01/25/2016    PCP: Dr. Oneil Pinal  REFERRING PROVIDER: [name], CNM  REFERRING DIAG: 11/25/23  THERAPY DIAG:  Muscle weakness (generalized)  Other lack of coordination  Pelvic pain  Other abnormalities of gait and mobility  Urinary, incontinence, stress female  Rationale for Evaluation and Treatment: Rehabilitation  ONSET DATE: 11/25/23: referral date, but onset since childhood but postpartum incr. S/s (07/19/23)  SUBJECTIVE:  SUBJECTIVE STATEMENT: Pt reported she's been doing her HEP and has been a bit sore, the glute squeezes are challenging in particular. Pt attempted intercourse and it is still painful: during intercourse: 4-5/10.   EVAL: URINARY FUNCTION: pt voids approx. Once an hour when drinking more water, on average every three hours. Pt has some pain when she voids, feels like she needs to go but doesn't always void when she gets to the toilet-not fully emptying. Pt has hx of yeast infections. Pt states stream is strong. Pt had epidurals and experienced urinary retention afterwards and it never fully recovered. SUI intermittently, before pregnancy (1-2x/time) and afterwards it was worse and now 1-2x/day again. Pt is breastfeeding baby, so she voids when she gets up at night. Hx of getting up about once/night prior to pregnancy. Pt used to wear pads pre-pregnancy but not recently-more of a dribble vs. Gush when leaking.  BOWEL FUNCTION: typically once a day and type 3-4 about 75% of the time. Pt denied pain or hemorrhoids. No pressure or bulging.  CORE STABILITY: hx of chronic SIJ pain, hx of cholecystectomy in 2017, no MVAs or falls. Pt states her core feels weak postpartum, vaginal delivery. No tearing.  SEXUAL FUNCTION: pt reported pain with  intercourse, initial and deep penetration. Stops intercourse from happening. Pt reported she does not feel that she has ever climaxed. She hasn't had pain during OBGYN exams in the past or tampon insertion, but hasn't attempted either postpartum.   Fluid intake: water in the am, Dr. Nunzio (one can/day), liquid IV with water, water (approx. 64 Oz.), sweet tea intermittently, no alcohol   PAIN:  Are you having pain? Yes 12/28/23 NPRS scale:2/10 Low Back pain Better: repositioning while lying down and sometimes when she lies down it hurts even worse before it settles down.  Aggravating factors: holding her baby  EVAL. Pelvic pain during intercourse: 6-8/10 with initial and deep intercourse and then achy for the next few hours.    PRECAUTIONS: None  RED FLAGS: None   WEIGHT BEARING RESTRICTIONS: No  FALLS:  Has patient fallen in last 6 months? No  OCCUPATION: occupational therapist-full-time   ACTIVITY LEVEL : on her feet all day at work, has not started a workout plan. Walks every night pushing stroller about 0.5 mile (at least).    PLOF: Independent  PATIENT GOALS: Feel like she has more control of pelvic floor, stop pain during intercourse, and stop leakage (less important).  PERTINENT HISTORY:  Ehlers-danlos syndrome, anxiety and depression, 10 joint surgeries (last one in 2023 2/2 ED), hx of migraine, seasonal allergies, joint pain Sexual abuse: No  BOWEL MOVEMENT: Pain with bowel movement: No Type of bowel movement:Frequency once daily Fully empty rectum: No Leakage: No Pads: No Fiber supplement/laxative No  URINATION: Pain with urination: Yes Fully empty bladder: Nopainful when she doesn't fully empty Stream: Strong Urgency: Yes  Frequency: once an hour to every three hours Leakage: Urge to void, Coughing, Sneezing, Laughing, and Lifting Pads: No but she used to wear pads  INTERCOURSE:  Ability to have vaginal penetration Yes  Pain with intercourse: Initial  Penetration, During Penetration, Deep Penetration, After Intercourse, and Pain Interrupts Intercourse DrynessYes  Climax: no [name] Scale: 3/3   PREGNANCY: Vaginal deliveries 1 Tearing No Episiotomy No C-section deliveries 0 Currently pregnant No  PROLAPSE: None But felt pressure postpartum  OBJECTIVE:  Note: Objective measures were completed at Evaluation unless otherwise noted.   COGNITION: Overall cognitive status: Within functional limits for tasks assessed  SENSATION: Light touch: Appears intact   GAIT: Assistive device utilized: None Comments: decr. Trunk rot  POSTURE: convex L tx spine curve, FHP, pt reported anterior pelvic tilt when carrying baby but was more neutral today incr. Postural sway during R and L SLS.   LUMBARAROM/PROM: WNL, L rot and sidebending incr. LBP and with ext  A/PROM A/PROM  eval  Flexion   Extension   Right lateral flexion   Left lateral flexion   Right rotation   Left rotation    (Blank rows = not tested)  LOWER EXTREMITY ROM: WFL  but B L IR and ER end range caused hip pain.  Active ROM Right eval Left eval  Hip flexion    Hip extension    Hip abduction    Hip adduction    Hip internal rotation    Hip external rotation    Knee flexion    Knee extension    Ankle dorsiflexion    Ankle plantarflexion    Ankle inversion    Ankle eversion     (Blank rows = not tested)  LOWER EXTREMITY MMT:  MMT Right eval Left eval  Hip flexion with pain in hip flexors 4- 4-  Hip extension    Hip abduction 3+ 3+  Hip adduction 4 3+  Hip internal rotation 4- 4-  Hip external rotation 4- 4-  Knee flexion 4- 4-  Knee extension 5 5  Ankle dorsiflexion 5 5  Ankle plantarflexion    Ankle inversion    Ankle eversion     (Blank rows = not tested) PALPATION: TTP over thoracolumbar junction, B glutes (R>L side), B hamstrings (lateral L and R but L pain worse), difficulty palpating for PFM externally, so performed internal  muscle assessment. Decr. Glute tension in R glute vs. L glute.  PELVIC MMT:   MMT eval  Vaginal   Internal Anal Sphincter   External Anal Sphincter   Puborectalis   Diastasis Recti   (Blank rows = not tested)        TONE: Decr. Glute tone, and incr. Tone in LLE   PROLAPSE: none   TODAY'S TREATMENT:                                                                                                                              DATE: 12/21/23    NMR: Access Code: [email or web address] URL: [email or web address] Date: 12/28/2023 Prepared by: Delon Pinal  Exercises - Hooklying Transversus Abdominis Palpation  - 1 x daily - 7 x weekly - 1 sets - 10 reps with cues - Prone Gluteal Sets  - 1 x daily - 7 x weekly - 1 sets - 10 reps, with palpation of glute max and hamstrings to ensure glutes firing first, L glute max noted to lag about 1 sec. Behind R glute max during contraction. - Clamshell with Resistance  - 1 x daily - 3 x weekly - 3 sets - 10 reps ADD RED  BAND WHEN READY. - Beginner Inside Leg Lift   - 1 x daily - 7 x weekly - 3 sets - 10 reps, cues to keep foot pointed straight ahead. - Dead Bug  - 1 x daily - 3 x weekly - 4 sets - 5 reps (NEW)   Cues and demo for proper technique and to coordinate breath with each movement. S for safety. Pt required DKTC with rocking side to side after dead bug 2/2 SIJ pain, see below.SABRA    MANUAL THERAPY: R sacrum hypomobile with PAM, L sacrum good mobility noted, placed washcloth at R sacrum for 5 minutes and had pt perform double knee to chest with rocking to decr. R SIJ pain. Pt reported 5/10 R SIJ pain upon lying down and then 1/10 after washcloth with amb.    SELF CARE: PATIENT EDUCATION:  Education details: PT progressed HEP and educated on SIJ findings and treatment. Person educated: Patient Education method: Explanation, Demonstration, and Handouts Education comprehension: verbalized understanding and needs further  education  HOME EXERCISE PROGRAM: [id number] medbridge.  ASSESSMENT:  CLINICAL IMPRESSION: Skilled session focused on progressing HEP to improve strength to decr. Tension on PFM and decr. Back/SIJ pain.  Pt demonstrated progress as she was able to tolerate progressed core strengthening and required less cues. R sacrum hypomobility noted with pain decr. After placing washcloth at R sacrum for 5 minutes. The following impairments continue to be noted upon exam: back/SIJ pain, postural dysfunction (more neutral to post. Pelvic tilt) , decr. Strength (especially hips and core) impaired SLS balance, pelvic pain, SUI. Pt would continue to benefit from skilled PT to improve safety and decr. Pain during all ADLs.    OBJECTIVE IMPAIRMENTS: Abnormal gait, decreased balance, decreased coordination, decreased strength, improper body mechanics, postural dysfunction, and pain.   ACTIVITY LIMITATIONS: carrying, lifting, bending, standing, squatting, continence, toileting, locomotion level, and caring for others  PARTICIPATION LIMITATIONS: meal prep, cleaning, laundry, interpersonal relationship, and occupation  PERSONAL FACTORS: Past/current experiences, Profession, and 3+ comorbidities: see above are also affecting patient's functional outcome.   REHAB POTENTIAL: Good  CLINICAL DECISION MAKING: Evolving/moderate complexity  EVALUATION COMPLEXITY: Moderate   GOALS: Goals reviewed with patient? Yes  SHORT TERM GOALS: Target date: for all STGS: 01/05/24  Pt will be IND in HEP to improve pain, strength, coordination. Baseline: no HEP Goal status: INITIAL  2.  Finish exam and write goals as indicated. Baseline: limited by time constraints Goal status: MET  3.  Pt will demo proper toileting posture to fully empty bladder. Baseline: unable to demo Goal status: INITIAL  4.  Pt will demonstrated improved relaxation and contraction of PFM with coordination of breath to reduce urinary leakage to  </=three/week. Baseline: 1-2x/day Goal status: INITIAL  5.  Pt will demonstrate improved relaxation and contraction of pelvic floor muscles (PFM) with coordination of breath to decr. Pain to </=5/10 with intercourse with spouse. Baseline: 6-8/10 and stops intercourse Goal status: INITIAL   LONG TERM GOALS: Target date: 02/02/24   Pt will demonstrate proper lifting technique and biomechanics when carrying baby, pushing stroller and performing ADLs to decr. Pain. Baseline: unable to demo, incr. APT per pt when lifting baby Goal status: INITIAL  2.  Pt will demonstrate improved relaxation and contraction of pelvic floor muscles (PFM) with coordination of breath to decr. Pain to </=1/10 with intercourse with spouse. Baseline: 6-8/10 and stops intercourse Goal status: INITIAL  3.  Pt will demonstrated improved relaxation and contraction of PFM with coordination of breath  to reduce urinary leakage to </=once/week. Baseline: 1-2x/day Goal status: INITIAL   PLAN:check STGs, internal muscle assessment, review HEP and scar mob.  PT FREQUENCY: 1x/week  PT DURATION: 8 weeks  PLANNED INTERVENTIONS: 97164- PT Re-evaluation, 97110-Therapeutic exercises, 97530- Therapeutic activity, 97112- Neuromuscular re-education, 97535- Self Care, [id number]- Manual therapy, [phone]- Gait training, [phone] (1-2 muscles), 20561 (3+ muscles)- Dry Needling, Patient/Family education, Balance training, Taping, Joint mobilization, Spinal mobilization, Scar mobilization, Cryotherapy, Moist heat, and Biofeedback    Keelon Zurn L, PT 12/28/2023, 8:44 AM  Delon Pinal, PT,DPT 12/28/23 8:44 AM Phone: [phone] Fax: [phone]

## 2023-12-31 NOTE — Progress Notes (Unsigned)
 Cleotilde Oneil FALCON, MD   No chief complaint on file.   HPI:      Alexa Grant is a 28 y.o. G1P1001 whose LMP was No LMP recorded., presents today for ***    Patient Active Problem List   Diagnosis Date Noted   Acute reaction to situational stress 12/18/2023   Postpartum care following vaginal delivery 07/19/2023   Vaginal delivery 07/19/2023   Rubella non-immune status, antepartum 03/02/2023   Low-lying placenta-resolved 03/02/2023   Ehlers-Danlos syndrome 02/02/2023   Supervision of high risk pregnancy, antepartum 12/10/2022   Anxiety and depression 05/13/2019   Biliary dyskinesia 01/25/2016    Past Surgical History:  Procedure Laterality Date   CHOLECYSTECTOMY N/A 01/25/2016   Procedure: LAPAROSCOPIC CHOLECYSTECTOMY WITH INTRAOPERATIVE CHOLANGIOGRAM;  Surgeon: [name], MD;  Location: [facility] OR;  Service: General;  Laterality: N/A;   COLONOSCOPY WITH ESOPHAGOGASTRODUODENOSCOPY (EGD)     HIP SURGERY Bilateral    labrum repair   HIP SURGERY Right 2023   remove hardware   SHOULDER SURGERY Bilateral    labrum repair    Family History  Problem Relation Age of Onset   Healthy Mother    Healthy Father    Healthy Sister    Healthy Maternal Grandmother    Healthy Maternal Grandfather    Liver disease Paternal Grandmother    Diabetes Paternal Grandfather    Congenital heart disease Paternal Grandfather    Liver disease Paternal Grandfather    Breast cancer Paternal Aunt        not sure of age    Social History   Socioeconomic History   Marital status: Married    Spouse name: Lorrene   Number of children: 0   Years of education: 18   Highest education level: Not on file  Occupational History   Occupation: occupational therapist  Tobacco Use   Smoking status: Never   Smokeless tobacco: Never  Vaping Use   Vaping status: Never Used  Substance and Sexual Activity   Alcohol use: No   Drug use: No   Sexual activity: Yes    Partners: Male    Birth  control/protection: None  Other Topics Concern   Not on file  Social History Narrative   Not on file   Social Drivers of Health   Financial Resource Strain: Low Risk  (12/31/2022)   Received from [facility]   Overall Financial Resource Strain (CARDIA)    Difficulty of Paying Living Expenses: Not hard at all  Food Insecurity: No Food Insecurity (07/19/2023)   Hunger Vital Sign    Worried About Running Out of Food in the Last Year: Never true    Ran Out of Food in the Last Year: Never true  Transportation Needs: No Transportation Needs (07/19/2023)   PRAPARE - Administrator, Civil Service (Medical): No    Lack of Transportation (Non-Medical): No  Physical Activity: Insufficiently Active (12/10/2022)   Exercise Vital Sign    Days of Exercise per Week: 1 day    Minutes of Exercise per Session: 30 min  Stress: Stress Concern Present (12/10/2022)   [employer] of Occupational Health - Occupational Stress Questionnaire    Feeling of Stress : To some extent  Social Connections: Moderately Integrated (07/19/2023)   Social Connection and Isolation Panel    Frequency of Communication with Friends and Family: More than three times a week    Frequency of Social Gatherings with Friends and Family: More than three times  a week    Attends Religious Services: More than 4 times per year    Active Member of Clubs or Organizations: No    Attends Banker Meetings: Never    Marital Status: Married  Catering manager Violence: Not At Risk (07/19/2023)   Humiliation, Afraid, Rape, and Kick questionnaire    Fear of Current or Ex-Partner: No    Emotionally Abused: No    Physically Abused: No    Sexually Abused: No    Outpatient Medications Prior to Visit  Medication Sig Dispense Refill   cephALEXin  (KEFLEX ) 500 MG capsule Take 1 capsule (500 mg total) by mouth 4 (four) times daily. (Patient not taking: Reported on 12/17/2023) 56 capsule 0   norethindrone   (MICRONOR ) 0.35 MG tablet Take 1 tablet (0.35 mg total) by mouth daily. May start 4 weeks postpartum 30 tablet 11   Prenatal Vit-Fe Fumarate-FA (PRENATAL MULTIVITAMIN) TABS tablet Take 1 tablet by mouth daily at 12 noon.     sertraline  (ZOLOFT ) 100 MG tablet TAKE 1 TABLET(100 MG) BY MOUTH DAILY 90 tablet 1   No facility-administered medications prior to visit.      ROS:  Review of Systems   OBJECTIVE:   Vitals:  There were no vitals taken for this visit.  Physical Exam  Results: No results found for this or any previous visit (from the past 24 hours).   Assessment/Plan: Birth control counseling    No orders of the defined types were placed in this encounter.    Mathis LITTIE Getting, CMA 12/31/2023 11:27 AM

## 2024-01-01 ENCOUNTER — Ambulatory Visit (INDEPENDENT_AMBULATORY_CARE_PROVIDER_SITE_OTHER): Admitting: Certified Nurse Midwife

## 2024-01-01 ENCOUNTER — Encounter: Payer: Self-pay | Admitting: Certified Nurse Midwife

## 2024-01-01 VITALS — BP 93/66 | HR 65 | Ht 65.0 in | Wt 153.7 lb

## 2024-01-01 DIAGNOSIS — F419 Anxiety disorder, unspecified: Secondary | ICD-10-CM

## 2024-01-01 DIAGNOSIS — R5383 Other fatigue: Secondary | ICD-10-CM | POA: Diagnosis not present

## 2024-01-01 DIAGNOSIS — N941 Unspecified dyspareunia: Secondary | ICD-10-CM

## 2024-01-01 DIAGNOSIS — Z3009 Encounter for other general counseling and advice on contraception: Secondary | ICD-10-CM

## 2024-01-01 DIAGNOSIS — D649 Anemia, unspecified: Secondary | ICD-10-CM | POA: Diagnosis not present

## 2024-01-01 DIAGNOSIS — F53 Postpartum depression: Secondary | ICD-10-CM

## 2024-01-01 DIAGNOSIS — Z1329 Encounter for screening for other suspected endocrine disorder: Secondary | ICD-10-CM

## 2024-01-01 DIAGNOSIS — F32A Depression, unspecified: Secondary | ICD-10-CM

## 2024-01-01 NOTE — Patient Instructions (Signed)

## 2024-01-02 ENCOUNTER — Ambulatory Visit: Payer: Self-pay | Admitting: Certified Nurse Midwife

## 2024-01-02 LAB — VITAMIN D 25 HYDROXY (VIT D DEFICIENCY, FRACTURES): Vit D, 25-Hydroxy: 36.6 ng/mL (ref 30.0–100.0)

## 2024-01-02 LAB — TSH RFX ON ABNORMAL TO FREE T4: TSH: 0.89 u[IU]/mL (ref 0.450–4.500)

## 2024-01-02 LAB — VITAMIN B12: Vitamin B-12: 592 pg/mL (ref 232–1245)

## 2024-01-02 MED ORDER — ESTRADIOL 0.1 MG/GM VA CREA: TOPICAL_CREAM | VAGINAL | 3 refills | Status: AC

## 2024-01-02 NOTE — Addendum Note (Signed)
 Addended by: JAYNE RAISIN on: 01/02/2024 12:01 PM   Modules accepted: Orders

## 2024-01-04 ENCOUNTER — Other Ambulatory Visit: Payer: Self-pay

## 2024-01-04 ENCOUNTER — Ambulatory Visit

## 2024-01-04 DIAGNOSIS — R278 Other lack of coordination: Secondary | ICD-10-CM

## 2024-01-04 DIAGNOSIS — N393 Stress incontinence (female) (male): Secondary | ICD-10-CM

## 2024-01-04 DIAGNOSIS — M6281 Muscle weakness (generalized): Secondary | ICD-10-CM | POA: Diagnosis not present

## 2024-01-04 DIAGNOSIS — R102 Pelvic and perineal pain: Secondary | ICD-10-CM

## 2024-01-04 DIAGNOSIS — R2689 Other abnormalities of gait and mobility: Secondary | ICD-10-CM

## 2024-01-04 NOTE — Therapy (Signed)
 OUTPATIENT PHYSICAL THERAPY FEMALE PELVIC TREATMENT   Patient Name: Alexa Grant MRN: [id number] DOB:02/05/1996, 28 y.o., female Today's Date: 01/04/2024  END OF SESSION:  PT End of Session - 01/04/24 0802     Visit Number 5    Number of Visits 9    Date for Recertification  02/06/24    Authorization Type BCBS    Progress Note Due on Visit 10    PT Start Time 0801    PT Stop Time 0843    PT Time Calculation (min) 42 min    Activity Tolerance Patient tolerated treatment well    Behavior During Therapy [facility] for tasks assessed/performed          Past Medical History:  Diagnosis Date   Biliary dyskinesia 01/25/2016   Family history of adverse reaction to anesthesia    dad gets sick with anesthesia   Gastric ulcer    GERD (gastroesophageal reflux disease)    takes Pantoprazole  daily   Headache    History of migraine    few yrs ago   Joint pain    PONV (postoperative nausea and vomiting)    Seasonal allergies    Tylenol  Sinus and Nasonex as needed   Supervision of high risk pregnancy, antepartum 12/10/2022              Clinical Staff    Provider      Office Location     [facility]    Dating     8/19 [redacted]w[redacted]d      Language     English    Anatomy US             Flu Vaccine     UTD    Genetic Screen     NIPS: low risk, XX      TDaP vaccine      offer    Hgb A1C or   GTT    Early :  Third trimester :       Covid    One booster         LAB RESULTS       Rhogam     A/Positive/-- (09/16 0849)     Blood Type    Vaccine for human papilloma virus (HPV) types 6, 11, 16, and 18 administered    Past Surgical History:  Procedure Laterality Date   CHOLECYSTECTOMY N/A 01/25/2016   Procedure: LAPAROSCOPIC CHOLECYSTECTOMY WITH INTRAOPERATIVE CHOLANGIOGRAM;  Surgeon: [name], MD;  Location: [facility] OR;  Service: General;  Laterality: N/A;   COLONOSCOPY WITH ESOPHAGOGASTRODUODENOSCOPY (EGD)     HIP SURGERY Bilateral    labrum repair   HIP SURGERY Right 2023   remove hardware    SHOULDER SURGERY Bilateral    labrum repair   Patient Active Problem List   Diagnosis Date Noted   Acute reaction to situational stress 12/18/2023   Rubella non-immune status, antepartum 03/02/2023   Ehlers-Danlos disease 12/31/2022   Anxiety and depression 05/13/2019   Biliary dyskinesia 01/25/2016    PCP: Dr. Oneil Pinal  REFERRING PROVIDER: [name], CNM  REFERRING DIAG: 11/25/23  THERAPY DIAG:  Muscle weakness (generalized)  Other lack of coordination  Pelvic pain  Other abnormalities of gait and mobility  Urinary, incontinence, stress female  Rationale for Evaluation and Treatment: Rehabilitation  ONSET DATE: 11/25/23: referral date, but onset since childhood but postpartum incr. S/s (07/19/23)  SUBJECTIVE:  SUBJECTIVE STATEMENT: Pt reported she had a busy week and didn't perform HEP as consistent, performed strengthening twice a week. She went kayaking yesterday and feels it in her hips today. SUI has incr. This week, she is wearing 3-4 pantyliners a day as underwear has been wet. Rolled towel for SIJ has been helpful and feels less sore, tried it on the L side too as it felt sore afterwards, and feels much better.   EVAL: URINARY FUNCTION: pt voids approx. Once an hour when drinking more water, on average every three hours. Pt has some pain when she voids, feels like she needs to go but doesn't always void when she gets to the toilet-not fully emptying. Pt has hx of yeast infections. Pt states stream is strong. Pt had epidurals and experienced urinary retention afterwards and it never fully recovered. SUI intermittently, before pregnancy (1-2x/time) and afterwards it was worse and now 1-2x/day again. Pt is breastfeeding baby, so she voids when she gets up at night. Hx of getting up  about once/night prior to pregnancy. Pt used to wear pads pre-pregnancy but not recently-more of a dribble vs. Gush when leaking.  BOWEL FUNCTION: typically once a day and type 3-4 about 75% of the time. Pt denied pain or hemorrhoids. No pressure or bulging.  CORE STABILITY: hx of chronic SIJ pain, hx of cholecystectomy in 2017, no MVAs or falls. Pt states her core feels weak postpartum, vaginal delivery. No tearing.  SEXUAL FUNCTION: pt reported pain with intercourse, initial and deep penetration. Stops intercourse from happening. Pt reported she does not feel that she has ever climaxed. She hasn't had pain during OBGYN exams in the past or tampon insertion, but hasn't attempted either postpartum.   Fluid intake: water in the am, Dr. Nunzio (one can/day), liquid IV with water, water (approx. 64 Oz.), sweet tea intermittently, no alcohol   PAIN:  Are you having pain? Yes 01/04/24 NPRS scale: 2/10 Low Back pain Better: repositioning while lying down and sometimes when she lies down it hurts even worse before it settles down.  Aggravating factors: holding her baby  EVAL. Pelvic pain during intercourse: 6-8/10 with initial and deep intercourse and then achy for the next few hours.    PRECAUTIONS: None  RED FLAGS: None   WEIGHT BEARING RESTRICTIONS: No  FALLS:  Has patient fallen in last 6 months? No  OCCUPATION: occupational therapist-full-time   ACTIVITY LEVEL : on her feet all day at work, has not started a workout plan. Walks every night pushing stroller about 0.5 mile (at least).    PLOF: Independent  PATIENT GOALS: Feel like she has more control of pelvic floor, stop pain during intercourse, and stop leakage (less important).  PERTINENT HISTORY:  Ehlers-danlos syndrome, anxiety and depression, 10 joint surgeries (last one in 2023 2/2 ED), hx of migraine, seasonal allergies, joint pain Sexual abuse: No  BOWEL MOVEMENT: Pain with bowel movement: No Type of bowel  movement:Frequency once daily Fully empty rectum: No Leakage: No Pads: No Fiber supplement/laxative No  URINATION: Pain with urination: Yes Fully empty bladder: Nopainful when she doesn't fully empty Stream: Strong Urgency: Yes  Frequency: once an hour to every three hours Leakage: Urge to void, Coughing, Sneezing, Laughing, and Lifting Pads: No but she used to wear pads  INTERCOURSE:  Ability to have vaginal penetration Yes  Pain with intercourse: Initial Penetration, During Penetration, Deep Penetration, After Intercourse, and Pain Interrupts Intercourse DrynessYes  Climax: no [name] Scale: 3/3   PREGNANCY: Vaginal deliveries  1 Tearing No Episiotomy No C-section deliveries 0 Currently pregnant No  PROLAPSE: None But felt pressure postpartum  OBJECTIVE:  Note: Objective measures were completed at Evaluation unless otherwise noted.   COGNITION: Overall cognitive status: Within functional limits for tasks assessed     SENSATION: Light touch: Appears intact   GAIT: Assistive device utilized: None Comments: decr. Trunk rot  POSTURE: convex L tx spine curve, FHP, pt reported anterior pelvic tilt when carrying baby but was more neutral today incr. Postural sway during R and L SLS.   LUMBARAROM/PROM: WNL, L rot and sidebending incr. LBP and with ext  A/PROM A/PROM  eval  Flexion   Extension   Right lateral flexion   Left lateral flexion   Right rotation   Left rotation    (Blank rows = not tested)  LOWER EXTREMITY ROM: WFL  but B L IR and ER end range caused hip pain.  Active ROM Right eval Left eval  Hip flexion    Hip extension    Hip abduction    Hip adduction    Hip internal rotation    Hip external rotation    Knee flexion    Knee extension    Ankle dorsiflexion    Ankle plantarflexion    Ankle inversion    Ankle eversion     (Blank rows = not tested)  LOWER EXTREMITY MMT:  MMT Right eval Left eval  Hip flexion with pain in hip  flexors 4- 4-  Hip extension    Hip abduction 3+ 3+  Hip adduction 4 3+  Hip internal rotation 4- 4-  Hip external rotation 4- 4-  Knee flexion 4- 4-  Knee extension 5 5  Ankle dorsiflexion 5 5  Ankle plantarflexion    Ankle inversion    Ankle eversion     (Blank rows = not tested) PALPATION: TTP over thoracolumbar junction, B glutes (R>L side), B hamstrings (lateral L and R but L pain worse), difficulty palpating for PFM externally, so performed internal muscle assessment. Decr. Glute tension in R glute vs. L glute.  PELVIC MMT:   MMT eval  Vaginal   Internal Anal Sphincter   External Anal Sphincter   Puborectalis   Diastasis Recti   (Blank rows = not tested)        TONE: Decr. Glute tone, and incr. Tone in LLE   PROLAPSE: none   TODAY'S TREATMENT:                                                                                                                              DATE: 01/04/24     MANUAL THERAPY: Internal PFM assessment: pt agreeable to internal vaginal assessment. Pt performed diaphragmatic breathing to improve relaxation of PFM as she had difficulty relaxing and gently bearing down with inhale. 2/10 pain during R sided palpation of levator ani, which ceased with trigger point release and discomfort with palpation of L side but not  pain per pt.  Cues to improve PFM lengthening with inhalation vs. Contraction. After manual therapy: tightness. PT also performed gentle cupping to L tx/lx paraspinals and STM to decr. Tension. Pt reported feeling better afterwards.    SELF CARE: PATIENT EDUCATION:  Education details:PT discussed goals with pt, internal muscle assessment and pelvic wand education and brochure to decr. Pelvic floor muscle trigger points to reduce pain.  Person educated: Patient Education method: Explanation, Demonstration, and Handouts Education comprehension: verbalized understanding and needs further education  HOME EXERCISE PROGRAM: [id number]  medbridge.  ASSESSMENT:  CLINICAL IMPRESSION: Skilled session focused on assessing goals, pt demonstrated progress as she met or partially met all STGs, except for SUI-as incr. PFM tension has incr. SUI this last week. Pt continues to experience difficulty coordinating PFM relaxation with breath and unclenching glutes at rest. Pt also noted to experience incr. Thoracolumbar fascia tension (L > R side) which decr. After manual therapy. The following impairments continue to be noted upon exam: back/SIJ pain, postural dysfunction (more neutral to post. Pelvic tilt) , decr. Strength (especially hips and core) impaired SLS balance, pelvic pain, SUI. Pt would continue to benefit from skilled PT to improve safety and decr. Pain during all ADLs.    OBJECTIVE IMPAIRMENTS: Abnormal gait, decreased balance, decreased coordination, decreased strength, improper body mechanics, postural dysfunction, and pain.   ACTIVITY LIMITATIONS: carrying, lifting, bending, standing, squatting, continence, toileting, locomotion level, and caring for others  PARTICIPATION LIMITATIONS: meal prep, cleaning, laundry, interpersonal relationship, and occupation  PERSONAL FACTORS: Past/current experiences, Profession, and 3+ comorbidities: see above are also affecting patient's functional outcome.   REHAB POTENTIAL: Good  CLINICAL DECISION MAKING: Evolving/moderate complexity  EVALUATION COMPLEXITY: Moderate   GOALS: Goals reviewed with patient? Yes  SHORT TERM GOALS: Target date: for all STGS: 01/05/24  Pt will be IND in HEP to improve pain, strength, coordination. Baseline: no HEP; 9/22: performing intermittently  Goal status: PARTIALLY MET  2.  Finish exam and write goals as indicated. Baseline: limited by time constraints Goal status: MET  3.  Pt will demo proper toileting posture to fully empty bladder. Baseline: unable to demo Goal status: MET  4.  Pt will demonstrated improved relaxation and contraction  of PFM with coordination of breath to reduce urinary leakage to </=three/week. Baseline: 1-2x/day; 9/22: it was improving but this last week it's incr. To at least twice/week. Goal status: ON-GOING  5.  Pt will demonstrate improved relaxation and contraction of pelvic floor muscles (PFM) with coordination of breath to decr. Pain to </=5/10 with intercourse with spouse. Baseline: 6-8/10 and stops intercourse. 9/22: 4-5/10 and 50/50 has to stop intercourse and still feels tense afterwards. Goal status: PARTIALLY MET   LONG TERM GOALS: Target date: 02/02/24   Pt will demonstrate proper lifting technique and biomechanics when carrying baby, pushing stroller and performing ADLs to decr. Pain. Baseline: unable to demo, incr. APT per pt when lifting baby Goal status: INITIAL  2.  Pt will demonstrate improved relaxation and contraction of pelvic floor muscles (PFM) with coordination of breath to decr. Pain to </=1/10 with intercourse with spouse. Baseline: 6-8/10 and stops intercourse Goal status: INITIAL  3.  Pt will demonstrated improved relaxation and contraction of PFM with coordination of breath to reduce urinary leakage to </=once/week. Baseline: 1-2x/day Goal status: INITIAL   PLAN: internal muscle assessment prn, review HEP (ensure relaxation) and scar mob.  PT FREQUENCY: 1x/week  PT DURATION: 8 weeks  PLANNED INTERVENTIONS: 97164- PT Re-evaluation, 97110-Therapeutic exercises, 97530- Therapeutic  activity, [id number]- Neuromuscular re-education, [phone]- Self Care, 02859- Manual therapy, [phone]- Gait training, [phone] (1-2 muscles), 20561 (3+ muscles)- Dry Needling, Patient/Family education, Balance training, Taping, Joint mobilization, Spinal mobilization, Scar mobilization, Cryotherapy, Moist heat, and Biofeedback    Margarita Bobrowski L, PT 01/04/2024, 8:03 AM  Delon Pinal, PT,DPT 01/04/24 8:03 AM Phone: [phone] Fax: [phone]

## 2024-01-12 ENCOUNTER — Ambulatory Visit: Payer: PRIVATE HEALTH INSURANCE

## 2024-01-12 ENCOUNTER — Other Ambulatory Visit: Payer: Self-pay

## 2024-01-12 DIAGNOSIS — R102 Pelvic and perineal pain unspecified side: Secondary | ICD-10-CM

## 2024-01-12 DIAGNOSIS — R2689 Other abnormalities of gait and mobility: Secondary | ICD-10-CM

## 2024-01-12 DIAGNOSIS — M6281 Muscle weakness (generalized): Secondary | ICD-10-CM | POA: Diagnosis not present

## 2024-01-12 DIAGNOSIS — N393 Stress incontinence (female) (male): Secondary | ICD-10-CM

## 2024-01-12 DIAGNOSIS — R278 Other lack of coordination: Secondary | ICD-10-CM

## 2024-01-12 NOTE — Progress Notes (Unsigned)
 Cleotilde Oneil FALCON, MD   No chief complaint on file.   HPI:      Alexa Grant is a 28 y.o. G1P1001 whose LMP was Patient's last menstrual period was 10/07/2022., presents today for ***     Patient Active Problem List   Diagnosis Date Noted   Acute reaction to situational stress 12/18/2023   Rubella non-immune status, antepartum 03/02/2023   Ehlers-Danlos disease 12/31/2022   Anxiety and depression 05/13/2019   Biliary dyskinesia 01/25/2016    Past Surgical History:  Procedure Laterality Date   CHOLECYSTECTOMY N/A 01/25/2016   Procedure: LAPAROSCOPIC CHOLECYSTECTOMY WITH INTRAOPERATIVE CHOLANGIOGRAM;  Surgeon: [name], MD;  Location: [facility] OR;  Service: General;  Laterality: N/A;   COLONOSCOPY WITH ESOPHAGOGASTRODUODENOSCOPY (EGD)     HIP SURGERY Bilateral    labrum repair   HIP SURGERY Right 2023   remove hardware   SHOULDER SURGERY Bilateral    labrum repair    Family History  Problem Relation Age of Onset   Healthy Mother    Healthy Father    Healthy Sister    Healthy Maternal Grandmother    Healthy Maternal Grandfather    Liver disease Paternal Grandmother    Diabetes Paternal Grandfather    Congenital heart disease Paternal Grandfather    Liver disease Paternal Grandfather    Breast cancer Paternal Aunt        not sure of age    Social History   Socioeconomic History   Marital status: Married    Spouse name: Lorrene   Number of children: 0   Years of education: 18   Highest education level: Not on file  Occupational History   Occupation: occupational therapist  Tobacco Use   Smoking status: Never   Smokeless tobacco: Never  Vaping Use   Vaping status: Never Used  Substance and Sexual Activity   Alcohol use: No   Drug use: No   Sexual activity: Yes    Partners: Male    Birth control/protection: None  Other Topics Concern   Not on file  Social History Narrative   Not on file   Social Drivers of Health   Financial Resource Strain:  Low Risk  (12/31/2022)   Received from [facility]   Overall Financial Resource Strain (CARDIA)    Difficulty of Paying Living Expenses: Not hard at all  Food Insecurity: No Food Insecurity (07/19/2023)   Hunger Vital Sign    Worried About Running Out of Food in the Last Year: Never true    Ran Out of Food in the Last Year: Never true  Transportation Needs: No Transportation Needs (07/19/2023)   PRAPARE - Administrator, Civil Service (Medical): No    Lack of Transportation (Non-Medical): No  Physical Activity: Insufficiently Active (12/10/2022)   Exercise Vital Sign    Days of Exercise per Week: 1 day    Minutes of Exercise per Session: 30 min  Stress: Stress Concern Present (12/10/2022)   [employer] of Occupational Health - Occupational Stress Questionnaire    Feeling of Stress : To some extent  Social Connections: Moderately Integrated (07/19/2023)   Social Connection and Isolation Panel    Frequency of Communication with Friends and Family: More than three times a week    Frequency of Social Gatherings with Friends and Family: More than three times a week    Attends Religious Services: More than 4 times per year    Active Member of [employer] or Organizations:  No    Attends Club or Organization Meetings: Never    Marital Status: Married  Catering manager Violence: Not At Risk (07/19/2023)   Humiliation, Afraid, Rape, and Kick questionnaire    Fear of Current or Ex-Partner: No    Emotionally Abused: No    Physically Abused: No    Sexually Abused: No    Outpatient Medications Prior to Visit  Medication Sig Dispense Refill   cephALEXin  (KEFLEX ) 500 MG capsule Take 1 capsule (500 mg total) by mouth 4 (four) times daily. (Patient not taking: Reported on 01/01/2024) 56 capsule 0   estradiol  (ESTRACE ) 0.1 MG/GM vaginal cream Place 0.25 Applicatorfuls vaginally at bedtime for 14 days, THEN 0.25 Applicatorfuls every other day for 14 days, THEN 0.25 Applicatorfuls 3  (three) times a week. 90 g 3   Prenatal Vit-Fe Fumarate-FA (PRENATAL MULTIVITAMIN) TABS tablet Take 1 tablet by mouth daily at 12 noon.     sertraline  (ZOLOFT ) 100 MG tablet TAKE 1 TABLET(100 MG) BY MOUTH DAILY 90 tablet 1   No facility-administered medications prior to visit.      ROS:  Review of Systems   OBJECTIVE:   Vitals:  LMP 10/07/2022   Physical Exam  Results: No results found for this or any previous visit (from the past 24 hours).   Assessment/Plan: There are no diagnoses linked to this encounter.    No orders of the defined types were placed in this encounter.    Mathis LITTIE Getting, CMA 01/12/2024 10:55 AM

## 2024-01-12 NOTE — Therapy (Addendum)
 OUTPATIENT PHYSICAL THERAPY FEMALE PELVIC TREATMENT   Patient Name: Alexa Grant MRN: [id number] DOB:Aug 17, 1995, 28 y.o., female Today's Date: 01/12/2024  END OF SESSION:  PT End of Session - 01/12/24 0940     Visit Number 6    Number of Visits 9    Date for Recertification  02/06/24    Authorization Type [employer]    Progress Note Due on Visit 10    PT Start Time [phone]   with IT as computer was shutting down and pt late   PT Stop Time 1012    PT Time Calculation (min) 34 min    Activity Tolerance Patient tolerated treatment well    Behavior During Therapy [facility] for tasks assessed/performed          Past Medical History:  Diagnosis Date   Biliary dyskinesia 01/25/2016   Family history of adverse reaction to anesthesia    dad gets sick with anesthesia   Gastric ulcer    GERD (gastroesophageal reflux disease)    takes Pantoprazole  daily   Headache    History of migraine    few yrs ago   Joint pain    PONV (postoperative nausea and vomiting)    Seasonal allergies    Tylenol  Sinus and Nasonex as needed   Supervision of high risk pregnancy, antepartum 12/10/2022              Clinical Staff    Provider      Office Location     [facility]    Dating     8/19 [redacted]w[redacted]d      Language     English    Anatomy US             Flu Vaccine     UTD    Genetic Screen     NIPS: low risk, XX      TDaP vaccine      offer    Hgb A1C or   GTT    Early :  Third trimester :       Covid    One booster         LAB RESULTS       Rhogam     A/Positive/-- (09/16 0849)     Blood Type    Vaccine for human papilloma virus (HPV) types 6, 11, 16, and 18 administered    Past Surgical History:  Procedure Laterality Date   CHOLECYSTECTOMY N/A 01/25/2016   Procedure: LAPAROSCOPIC CHOLECYSTECTOMY WITH INTRAOPERATIVE CHOLANGIOGRAM;  Surgeon: [name], MD;  Location: [facility] OR;  Service: General;  Laterality: N/A;   COLONOSCOPY WITH ESOPHAGOGASTRODUODENOSCOPY (EGD)     HIP SURGERY Bilateral    labrum repair    HIP SURGERY Right 2023   remove hardware   SHOULDER SURGERY Bilateral    labrum repair   Patient Active Problem List   Diagnosis Date Noted   Acute reaction to situational stress 12/18/2023   Rubella non-immune status, antepartum 03/02/2023   Ehlers-Danlos disease 12/31/2022   Anxiety and depression 05/13/2019   Biliary dyskinesia 01/25/2016    PCP: Dr. Oneil Pinal  REFERRING PROVIDER: [name], CNM  REFERRING DIAG: 11/25/23  THERAPY DIAG:  Muscle weakness (generalized)  Other lack of coordination  Pelvic pain  Other abnormalities of gait and mobility  Urinary, incontinence, stress female  Rationale for Evaluation and Treatment: Rehabilitation  ONSET DATE: 11/25/23: referral date, but onset since childhood but postpartum incr. S/s (07/19/23)  SUBJECTIVE:  SUBJECTIVE STATEMENT: Pt was busy all weekend and holding her daughter so she feels her posture hasn't been great. She was able to perform HEP though. Pt reported leakage has been better this week. Has not had intercourse since last visit.   EVAL: URINARY FUNCTION: pt voids approx. Once an hour when drinking more water, on average every three hours. Pt has some pain when she voids, feels like she needs to go but doesn't always void when she gets to the toilet-not fully emptying. Pt has hx of yeast infections. Pt states stream is strong. Pt had epidurals and experienced urinary retention afterwards and it never fully recovered. SUI intermittently, before pregnancy (1-2x/time) and afterwards it was worse and now 1-2x/day again. Pt is breastfeeding baby, so she voids when she gets up at night. Hx of getting up about once/night prior to pregnancy. Pt used to wear pads pre-pregnancy but not recently-more of a dribble vs. Gush when leaking.   BOWEL FUNCTION: typically once a day and type 3-4 about 75% of the time. Pt denied pain or hemorrhoids. No pressure or bulging.  CORE STABILITY: hx of chronic SIJ pain, hx of cholecystectomy in 2017, no MVAs or falls. Pt states her core feels weak postpartum, vaginal delivery. No tearing.  SEXUAL FUNCTION: pt reported pain with intercourse, initial and deep penetration. Stops intercourse from happening. Pt reported she does not feel that she has ever climaxed. She hasn't had pain during OBGYN exams in the past or tampon insertion, but hasn't attempted either postpartum.   Fluid intake: water in the am, Dr. Nunzio (one can/day), liquid IV with water, water (approx. 64 Oz.), sweet tea intermittently, no alcohol   PAIN:  Are you having pain? Yes 01/12/24 NPRS scale: 3/10 Mid Back pain and L hip pain Better: repositioning while lying down and sometimes when she lies down it hurts even worse before it settles down.  Aggravating factors: holding her baby  EVAL. Pelvic pain during intercourse: 6-8/10 with initial and deep intercourse and then achy for the next few hours.    PRECAUTIONS: None  RED FLAGS: None   WEIGHT BEARING RESTRICTIONS: No  FALLS:  Has patient fallen in last 6 months? No  OCCUPATION: occupational therapist-full-time   ACTIVITY LEVEL : on her feet all day at work, has not started a workout plan. Walks every night pushing stroller about 0.5 mile (at least).    PLOF: Independent  PATIENT GOALS: Feel like she has more control of pelvic floor, stop pain during intercourse, and stop leakage (less important).  PERTINENT HISTORY:  Ehlers-danlos syndrome, anxiety and depression, 10 joint surgeries (last one in 2023 2/2 ED), hx of migraine, seasonal allergies, joint pain Sexual abuse: No  BOWEL MOVEMENT: Pain with bowel movement: No Type of bowel movement:Frequency once daily Fully empty rectum: No Leakage: No Pads: No Fiber supplement/laxative No  URINATION: Pain  with urination: Yes Fully empty bladder: Nopainful when she doesn't fully empty Stream: Strong Urgency: Yes  Frequency: once an hour to every three hours Leakage: Urge to void, Coughing, Sneezing, Laughing, and Lifting Pads: No but she used to wear pads  INTERCOURSE:  Ability to have vaginal penetration Yes  Pain with intercourse: Initial Penetration, During Penetration, Deep Penetration, After Intercourse, and Pain Interrupts Intercourse DrynessYes  Climax: no [name] Scale: 3/3   PREGNANCY: Vaginal deliveries 1 Tearing No Episiotomy No C-section deliveries 0 Currently pregnant No  PROLAPSE: None But felt pressure postpartum  OBJECTIVE:  Note: Objective measures were completed at Evaluation unless otherwise  noted.   COGNITION: Overall cognitive status: Within functional limits for tasks assessed     SENSATION: Light touch: Appears intact   GAIT: Assistive device utilized: None Comments: decr. Trunk rot  POSTURE: convex L tx spine curve, FHP, pt reported anterior pelvic tilt when carrying baby but was more neutral today incr. Postural sway during R and L SLS.   LUMBARAROM/PROM: WNL, L rot and sidebending incr. LBP and with ext  A/PROM A/PROM  eval  Flexion   Extension   Right lateral flexion   Left lateral flexion   Right rotation   Left rotation    (Blank rows = not tested)  LOWER EXTREMITY ROM: WFL  but B L IR and ER end range caused hip pain.  Active ROM Right eval Left eval  Hip flexion    Hip extension    Hip abduction    Hip adduction    Hip internal rotation    Hip external rotation    Knee flexion    Knee extension    Ankle dorsiflexion    Ankle plantarflexion    Ankle inversion    Ankle eversion     (Blank rows = not tested)  LOWER EXTREMITY MMT:  MMT Right eval Left eval  Hip flexion with pain in hip flexors 4- 4-  Hip extension    Hip abduction 3+ 3+  Hip adduction 4 3+  Hip internal rotation 4- 4-  Hip external  rotation 4- 4-  Knee flexion 4- 4-  Knee extension 5 5  Ankle dorsiflexion 5 5  Ankle plantarflexion    Ankle inversion    Ankle eversion     (Blank rows = not tested) PALPATION: TTP over thoracolumbar junction, B glutes (R>L side), B hamstrings (lateral L and R but L pain worse), difficulty palpating for PFM externally, so performed internal muscle assessment. Decr. Glute tension in R glute vs. L glute.  PELVIC MMT:   MMT eval  Vaginal   Internal Anal Sphincter   External Anal Sphincter   Puborectalis   Diastasis Recti   (Blank rows = not tested)        TONE: Decr. Glute tone, and incr. Tone in LLE   PROLAPSE: none   TODAY'S TREATMENT:                                                                                                                              DATE: 01/12/24     MANUAL THERAPY: Internal PFM assessment: pt agreeable to internal vaginal assessment with focus to improve PFM lengthening with inhalation vs. Contraction. PT had pt performed 2x5 reps of lengthening/gentle bearing down, which improved after trigger point release of TTP muscles: B levator ani and B OI (R 6/10 pain and L side 4/10 pain). After manual therapy: no incr. In pain. Incr pain during PAMs of tx spine and during palpation of R paraspinals.  NMR:  Assessed B rhomboid strength (3+/5) and B lower  trap strength 3/5. - Single Arm Low Trap Setting at Wall  - 1 x daily - 3 x weekly - 3 sets - 10 reps - Supine 90/90 Overhead Dumbbell Raise  - 1 x daily - 3 x weekly - 3 sets - 10 reps Cues and demo for proper technique. S for safety. Incr. Upper trap activation noted.    SELF CARE: PATIENT EDUCATION:  Education details:PT educated pt on using pelvic wand again and added to HEP to reduce back pain which can impact PFM tension by not allowing proper diaphragm expansion. Person educated: Patient Education method: Explanation, Demonstration, and Handouts Education comprehension: verbalized  understanding and needs further education  HOME EXERCISE PROGRAM: [id number] [employer].  ASSESSMENT:  CLINICAL IMPRESSION: Skilled session focused on decr. PFM tension by using trigger point release and cues to lengthen PFM during inhale. Added to upper UE and back strength training to reduce pain and improve ability to not guard so pt can fully expand diaphragm to allow PFM tension to decr. The following impairments continue to be noted upon exam: back/SIJ pain, postural dysfunction (more neutral to post. Pelvic tilt) , decr. Strength (especially hips and core) impaired SLS balance, pelvic pain, SUI. Pt would continue to benefit from skilled PT to improve safety and decr. Pain during all ADLs.    OBJECTIVE IMPAIRMENTS: Abnormal gait, decreased balance, decreased coordination, decreased strength, improper body mechanics, postural dysfunction, and pain.   ACTIVITY LIMITATIONS: carrying, lifting, bending, standing, squatting, continence, toileting, locomotion level, and caring for others  PARTICIPATION LIMITATIONS: meal prep, cleaning, laundry, interpersonal relationship, and occupation  PERSONAL FACTORS: Past/current experiences, Profession, and 3+ comorbidities: see above are also affecting patient's functional outcome.   REHAB POTENTIAL: Good  CLINICAL DECISION MAKING: Evolving/moderate complexity  EVALUATION COMPLEXITY: Moderate   GOALS: Goals reviewed with patient? Yes  SHORT TERM GOALS: Target date: for all STGS: 01/05/24  Pt will be IND in HEP to improve pain, strength, coordination. Baseline: no HEP; 9/22: performing intermittently  Goal status: PARTIALLY MET  2.  Finish exam and write goals as indicated. Baseline: limited by time constraints Goal status: MET  3.  Pt will demo proper toileting posture to fully empty bladder. Baseline: unable to demo Goal status: MET  4.  Pt will demonstrated improved relaxation and contraction of PFM with coordination of breath to reduce  urinary leakage to </=three/week. Baseline: 1-2x/day; 9/22: it was improving but this last week it's incr. To at least twice/week. Goal status: ON-GOING  5.  Pt will demonstrate improved relaxation and contraction of pelvic floor muscles (PFM) with coordination of breath to decr. Pain to </=5/10 with intercourse with spouse. Baseline: 6-8/10 and stops intercourse. 9/22: 4-5/10 and 50/50 has to stop intercourse and still feels tense afterwards. Goal status: PARTIALLY MET   LONG TERM GOALS: Target date: 02/02/24   Pt will demonstrate proper lifting technique and biomechanics when carrying baby, pushing stroller and performing ADLs to decr. Pain. Baseline: unable to demo, incr. APT per pt when lifting baby Goal status: INITIAL  2.  Pt will demonstrate improved relaxation and contraction of pelvic floor muscles (PFM) with coordination of breath to decr. Pain to </=1/10 with intercourse with spouse. Baseline: 6-8/10 and stops intercourse Goal status: INITIAL  3.  Pt will demonstrated improved relaxation and contraction of PFM with coordination of breath to reduce urinary leakage to </=once/week. Baseline: 1-2x/day Goal status: INITIAL   PLAN: internal muscle assessment prn, review HEP (ensure relaxation) and added to strengthening and paraspinal reactions.  PT FREQUENCY: 1x/week  PT DURATION: 8 weeks  PLANNED INTERVENTIONS: 97164- PT Re-evaluation, 97110-Therapeutic exercises, 97530- Therapeutic activity, 97112- Neuromuscular re-education, 97535- Self Care, [id number]- Manual therapy, [phone]- Gait training, [phone] (1-2 muscles), [id number] (3+ muscles)- Dry Needling, Patient/Family education, Balance training, Taping, Joint mobilization, Spinal mobilization, Scar mobilization, Cryotherapy, Moist heat, and Biofeedback    Ceira Hoeschen L, PT 01/12/2024, 10:15 AM  Delon Pinal, PT,DPT 01/12/24 10:15 AM Phone: [phone] Fax: [phone]

## 2024-01-14 ENCOUNTER — Telehealth: Admitting: Certified Nurse Midwife

## 2024-01-14 DIAGNOSIS — F419 Anxiety disorder, unspecified: Secondary | ICD-10-CM

## 2024-01-14 DIAGNOSIS — F53 Postpartum depression: Secondary | ICD-10-CM | POA: Diagnosis not present

## 2024-01-14 DIAGNOSIS — Z1332 Encounter for screening for maternal depression: Secondary | ICD-10-CM

## 2024-01-14 MED ORDER — ESCITALOPRAM OXALATE 10 MG PO TABS: 10.0000 mg | ORAL_TABLET | Freq: Every day | ORAL | 1 refills | Status: DC

## 2024-01-15 ENCOUNTER — Encounter: Payer: Self-pay | Admitting: Certified Nurse Midwife

## 2024-01-18 ENCOUNTER — Ambulatory Visit: Attending: Certified Nurse Midwife

## 2024-01-18 ENCOUNTER — Other Ambulatory Visit: Payer: Self-pay

## 2024-01-18 DIAGNOSIS — R278 Other lack of coordination: Secondary | ICD-10-CM | POA: Diagnosis present

## 2024-01-18 DIAGNOSIS — R102 Pelvic and perineal pain unspecified side: Secondary | ICD-10-CM | POA: Insufficient documentation

## 2024-01-18 DIAGNOSIS — R2689 Other abnormalities of gait and mobility: Secondary | ICD-10-CM | POA: Insufficient documentation

## 2024-01-18 DIAGNOSIS — N393 Stress incontinence (female) (male): Secondary | ICD-10-CM | POA: Diagnosis present

## 2024-01-18 DIAGNOSIS — M6281 Muscle weakness (generalized): Secondary | ICD-10-CM | POA: Diagnosis present

## 2024-01-18 NOTE — Therapy (Signed)
 OUTPATIENT PHYSICAL THERAPY FEMALE PELVIC TREATMENT   Patient Name: Alexa Grant MRN: [id number] DOB:1995-07-09, 28 y.o., female Today's Date: 01/18/2024  END OF SESSION:  PT End of Session - 01/12/24 0940     Visit Number 7   Number of Visits 9    Date for Recertification  02/06/24    Authorization Type BC[employer]    Progress Note Due on Visit 10    PT Start Time 0813  Pt late   PT Stop Time 0837    PT Time Calculation (min) 34 min    Activity Tolerance Patient tolerated treatment well    Behavior During Therapy [facility] for tasks assessed/performed          Past Medical History:  Diagnosis Date   Biliary dyskinesia 01/25/2016   Family history of adverse reaction to anesthesia    dad gets sick with anesthesia   Gastric ulcer    GERD (gastroesophageal reflux disease)    takes Pantoprazole  daily   Headache    History of migraine    few yrs ago   Joint pain    PONV (postoperative nausea and vomiting)    Seasonal allergies    Tylenol  Sinus and Nasonex as needed   Supervision of high risk pregnancy, antepartum 12/10/2022              Clinical Staff    Provider      Office Location     [facility]    Dating     8/19 [redacted]w[redacted]d      Language     English    Anatomy US             Flu Vaccine     UTD    Genetic Screen     NIPS: low risk, XX      TDaP vaccine      offer    Hgb A1C or   GTT    Early :  Third trimester :       Covid    One booster         LAB RESULTS       Rhogam     A/Positive/-- (09/16 0849)     Blood Type    Vaccine for human papilloma virus (HPV) types 6, 11, 16, and 18 administered    Past Surgical History:  Procedure Laterality Date   CHOLECYSTECTOMY N/A 01/25/2016   Procedure: LAPAROSCOPIC CHOLECYSTECTOMY WITH INTRAOPERATIVE CHOLANGIOGRAM;  Surgeon: [name], MD;  Location: [facility] OR;  Service: General;  Laterality: N/A;   COLONOSCOPY WITH ESOPHAGOGASTRODUODENOSCOPY (EGD)     HIP SURGERY Bilateral    labrum repair   HIP SURGERY Right 2023   remove hardware    SHOULDER SURGERY Bilateral    labrum repair   Patient Active Problem List   Diagnosis Date Noted   Acute reaction to situational stress 12/18/2023   Rubella non-immune status, antepartum 03/02/2023   Ehlers-Danlos disease 12/31/2022   Anxiety and depression 05/13/2019   Biliary dyskinesia 01/25/2016    PCP: Dr. Oneil Pinal  REFERRING PROVIDER: [name], CNM  REFERRING DIAG: 11/25/23  THERAPY DIAG:  Muscle weakness (generalized)  Other lack of coordination  Pelvic pain  Other abnormalities of gait and mobility  Urinary, incontinence, stress female  Rationale for Evaluation and Treatment: Rehabilitation  ONSET DATE: 11/25/23: referral date, but onset since childhood but postpartum incr. S/s (07/19/23)  SUBJECTIVE:  SUBJECTIVE STATEMENT: Pt reported her bladder leakage was worse last week, for three days. Pt ordered pelvic wand yesterday to decr. Tension. She's trying to be more aware of posture and her body and she feels very tense overall all the time.    EVAL: URINARY FUNCTION: pt voids approx. Once an hour when drinking more water, on average every three hours. Pt has some pain when she voids, feels like she needs to go but doesn't always void when she gets to the toilet-not fully emptying. Pt has hx of yeast infections. Pt states stream is strong. Pt had epidurals and experienced urinary retention afterwards and it never fully recovered. SUI intermittently, before pregnancy (1-2x/time) and afterwards it was worse and now 1-2x/day again. Pt is breastfeeding baby, so she voids when she gets up at night. Hx of getting up about once/night prior to pregnancy. Pt used to wear pads pre-pregnancy but not recently-more of a dribble vs. Gush when leaking.  BOWEL FUNCTION: typically once a day  and type 3-4 about 75% of the time. Pt denied pain or hemorrhoids. No pressure or bulging.  CORE STABILITY: hx of chronic SIJ pain, hx of cholecystectomy in 2017, no MVAs or falls. Pt states her core feels weak postpartum, vaginal delivery. No tearing.  SEXUAL FUNCTION: pt reported pain with intercourse, initial and deep penetration. Stops intercourse from happening. Pt reported she does not feel that she has ever climaxed. She hasn't had pain during OBGYN exams in the past or tampon insertion, but hasn't attempted either postpartum.   Fluid intake: water in the am, Dr. Nunzio (one can/day), liquid IV with water, water (approx. 64 Oz.), sweet tea intermittently, no alcohol   PAIN:  Are you having pain? Yes 01/18/24 NPRS scale: 3/10 Mid Back pain and now R hip pain Better: repositioning while lying down and sometimes when she lies down it hurts even worse before it settles down.  Aggravating factors: holding her baby  EVAL. Pelvic pain during intercourse: 6-8/10 with initial and deep intercourse and then achy for the next few hours.    PRECAUTIONS: None  RED FLAGS: None   WEIGHT BEARING RESTRICTIONS: No  FALLS:  Has patient fallen in last 6 months? No  OCCUPATION: occupational therapist-full-time   ACTIVITY LEVEL : on her feet all day at work, has not started a workout plan. Walks every night pushing stroller about 0.5 mile (at least).    PLOF: Independent  PATIENT GOALS: Feel like she has more control of pelvic floor, stop pain during intercourse, and stop leakage (less important).  PERTINENT HISTORY:  Ehlers-danlos syndrome, anxiety and depression, 10 joint surgeries (last one in 2023 2/2 ED), hx of migraine, seasonal allergies, joint pain Sexual abuse: No  BOWEL MOVEMENT: Pain with bowel movement: No Type of bowel movement:Frequency once daily Fully empty rectum: No Leakage: No Pads: No Fiber supplement/laxative No  URINATION: Pain with urination: Yes Fully empty  bladder: Nopainful when she doesn't fully empty Stream: Strong Urgency: Yes  Frequency: once an hour to every three hours Leakage: Urge to void, Coughing, Sneezing, Laughing, and Lifting Pads: No but she used to wear pads  INTERCOURSE:  Ability to have vaginal penetration Yes  Pain with intercourse: Initial Penetration, During Penetration, Deep Penetration, After Intercourse, and Pain Interrupts Intercourse DrynessYes  Climax: no [name] Scale: 3/3   PREGNANCY: Vaginal deliveries 1 Tearing No Episiotomy No C-section deliveries 0 Currently pregnant No  PROLAPSE: None But felt pressure postpartum  OBJECTIVE:  Note: Objective measures were completed at  Evaluation unless otherwise noted.   COGNITION: Overall cognitive status: Within functional limits for tasks assessed     SENSATION: Light touch: Appears intact   GAIT: Assistive device utilized: None Comments: decr. Trunk rot  POSTURE: convex L tx spine curve, FHP, pt reported anterior pelvic tilt when carrying baby but was more neutral today incr. Postural sway during R and L SLS.   LUMBARAROM/PROM: WNL, L rot and sidebending incr. LBP and with ext  A/PROM A/PROM  eval  Flexion   Extension   Right lateral flexion   Left lateral flexion   Right rotation   Left rotation    (Blank rows = not tested)  LOWER EXTREMITY ROM: WFL  but B L IR and ER end range caused hip pain.  Active ROM Right eval Left eval  Hip flexion    Hip extension    Hip abduction    Hip adduction    Hip internal rotation    Hip external rotation    Knee flexion    Knee extension    Ankle dorsiflexion    Ankle plantarflexion    Ankle inversion    Ankle eversion     (Blank rows = not tested)  LOWER EXTREMITY MMT:  MMT Right eval Left eval  Hip flexion with pain in hip flexors 4- 4-  Hip extension    Hip abduction 3+ 3+  Hip adduction 4 3+  Hip internal rotation 4- 4-  Hip external rotation 4- 4-  Knee flexion 4- 4-   Knee extension 5 5  Ankle dorsiflexion 5 5  Ankle plantarflexion    Ankle inversion    Ankle eversion     (Blank rows = not tested) PALPATION: TTP over thoracolumbar junction, B glutes (R>L side), B hamstrings (lateral L and R but L pain worse), difficulty palpating for PFM externally, so performed internal muscle assessment. Decr. Glute tension in R glute vs. L glute.  PELVIC MMT:   MMT eval  Vaginal   Internal Anal Sphincter   External Anal Sphincter   Puborectalis   Diastasis Recti   (Blank rows = not tested)        TONE: Decr. Glute tone, and incr. Tone in LLE   PROLAPSE: none   TODAY'S TREATMENT:                                                                                                                              DATE: 01/18/24     MANUAL THERAPY: Internal PFM assessment: pt agreeable to internal vaginal assessment with focus to improve PFM lengthening with inhalation vs. Contraction. PT performed  trigger point release of B levator ani and L coccygeus (TTP) muscles: B levator ani and L coccyxgeus (R 4-5/10 pain and L side 2/10 pain). After manual therapy: no incr. In pain. PT assessed sacrum, mobility has improved but pt reported it's still TTP and she's performing SIJ washcloth every other night. Pt performed guided body scan/mindfulness  with heat pad on lower abdomen with layers to reduce tension and pain.   SELF CARE: PATIENT EDUCATION:  Education details:PT educated pt on how to use pelvic wand again and to focus on mindfulness techniques daily to reduce pain and tension. Person educated: Patient Education method: Explanation, Demonstration, and Handouts Education comprehension: verbalized understanding and needs further education  HOME EXERCISE PROGRAM: [id number] [employer].  ASSESSMENT:  CLINICAL IMPRESSION: Skilled session focused on decr. PFM tension by using trigger point release and cues to lengthen PFM during inhale, along with mindfulness  techniques to decr. Tension and heat. Pt demonstrated progress as B PFM less painful with palpation than last visit. The following impairments continue to be noted upon exam: back/SIJ pain, postural dysfunction (more neutral to post. Pelvic tilt) , decr. Strength (especially hips and core) impaired SLS balance, pelvic pain, SUI. Pt would continue to benefit from skilled PT to improve safety and decr. Pain during all ADLs.    OBJECTIVE IMPAIRMENTS: Abnormal gait, decreased balance, decreased coordination, decreased strength, improper body mechanics, postural dysfunction, and pain.   ACTIVITY LIMITATIONS: carrying, lifting, bending, standing, squatting, continence, toileting, locomotion level, and caring for others  PARTICIPATION LIMITATIONS: meal prep, cleaning, laundry, interpersonal relationship, and occupation  PERSONAL FACTORS: Past/current experiences, Profession, and 3+ comorbidities: see above are also affecting patient's functional outcome.   REHAB POTENTIAL: Good  CLINICAL DECISION MAKING: Evolving/moderate complexity  EVALUATION COMPLEXITY: Moderate   GOALS: Goals reviewed with patient? Yes  SHORT TERM GOALS: Target date: for all STGS: 01/05/24  Pt will be IND in HEP to improve pain, strength, coordination. Baseline: no HEP; 9/22: performing intermittently  Goal status: PARTIALLY MET  2.  Finish exam and write goals as indicated. Baseline: limited by time constraints Goal status: MET  3.  Pt will demo proper toileting posture to fully empty bladder. Baseline: unable to demo Goal status: MET  4.  Pt will demonstrated improved relaxation and contraction of PFM with coordination of breath to reduce urinary leakage to </=three/week. Baseline: 1-2x/day; 9/22: it was improving but this last week it's incr. To at least twice/week. Goal status: ON-GOING  5.  Pt will demonstrate improved relaxation and contraction of pelvic floor muscles (PFM) with coordination of breath to  decr. Pain to </=5/10 with intercourse with spouse. Baseline: 6-8/10 and stops intercourse. 9/22: 4-5/10 and 50/50 has to stop intercourse and still feels tense afterwards. Goal status: PARTIALLY MET   LONG TERM GOALS: Target date: 02/02/24   Pt will demonstrate proper lifting technique and biomechanics when carrying baby, pushing stroller and performing ADLs to decr. Pain. Baseline: unable to demo, incr. APT per pt when lifting baby Goal status: INITIAL  2.  Pt will demonstrate improved relaxation and contraction of pelvic floor muscles (PFM) with coordination of breath to decr. Pain to </=1/10 with intercourse with spouse. Baseline: 6-8/10 and stops intercourse Goal status: INITIAL  3.  Pt will demonstrated improved relaxation and contraction of PFM with coordination of breath to reduce urinary leakage to </=once/week. Baseline: 1-2x/day Goal status: INITIAL   PLAN: internal muscle assessment prn, review HEP (ensure relaxation) and added to strengthening and paraspinal reactions.   PT FREQUENCY: 1x/week  PT DURATION: 8 weeks  PLANNED INTERVENTIONS: 97164- PT Re-evaluation, 97110-Therapeutic exercises, 97530- Therapeutic activity, 97112- Neuromuscular re-education, 97535- Self Care, [id number]- Manual therapy, [phone]- Gait training, [phone] (1-2 muscles), [id number] (3+ muscles)- Dry Needling, Patient/Family education, Balance training, Taping, Joint mobilization, Spinal mobilization, Scar mobilization, Cryotherapy, Moist heat, and Biofeedback    Kailyn Vanderslice L, PT  01/18/2024, 8:15 AM  Delon Pinal, PT,DPT 01/18/24 8:15 AM Phone: [phone] Fax: [phone]

## 2024-01-25 ENCOUNTER — Other Ambulatory Visit: Payer: Self-pay

## 2024-01-25 ENCOUNTER — Ambulatory Visit

## 2024-01-25 DIAGNOSIS — M6281 Muscle weakness (generalized): Secondary | ICD-10-CM | POA: Diagnosis not present

## 2024-01-25 DIAGNOSIS — R278 Other lack of coordination: Secondary | ICD-10-CM

## 2024-01-25 DIAGNOSIS — R2689 Other abnormalities of gait and mobility: Secondary | ICD-10-CM

## 2024-01-25 DIAGNOSIS — N393 Stress incontinence (female) (male): Secondary | ICD-10-CM

## 2024-01-25 DIAGNOSIS — R102 Pelvic and perineal pain unspecified side: Secondary | ICD-10-CM

## 2024-01-25 NOTE — Therapy (Addendum)
 OUTPATIENT PHYSICAL THERAPY FEMALE PELVIC TREATMENT   Patient Name: Alexa Grant MRN: [id number] DOB:1995/04/17, 28 y.o., female Today's Date: 01/25/2024  END OF SESSION:  PT End of Session - 01/12/24 0940     Visit Number 8   Number of Visits 9    Date for Recertification  02/06/24    Authorization Type BC[employer]    Progress Note Due on Visit 10    PT Start Time 0809  Pt late   PT Stop Time 0839    PT Time Calculation (min) 30 min    Activity Tolerance Patient tolerated treatment well    Behavior During Therapy [facility] for tasks assessed/performed          Past Medical History:  Diagnosis Date   Biliary dyskinesia 01/25/2016   Family history of adverse reaction to anesthesia    dad gets sick with anesthesia   Gastric ulcer    GERD (gastroesophageal reflux disease)    takes Pantoprazole  daily   Headache    History of migraine    few yrs ago   Joint pain    PONV (postoperative nausea and vomiting)    Seasonal allergies    Tylenol  Sinus and Nasonex as needed   Supervision of high risk pregnancy, antepartum 12/10/2022              Clinical Staff    Provider      Office Location     [facility]    Dating     8/19 [redacted]w[redacted]d      Language     English    Anatomy US             Flu Vaccine     UTD    Genetic Screen     NIPS: low risk, XX      TDaP vaccine      offer    Hgb A1C or   GTT    Early :  Third trimester :       Covid    One booster         LAB RESULTS       Rhogam     A/Positive/-- (09/16 0849)     Blood Type    Vaccine for human papilloma virus (HPV) types 6, 11, 16, and 18 administered    Past Surgical History:  Procedure Laterality Date   CHOLECYSTECTOMY N/A 01/25/2016   Procedure: LAPAROSCOPIC CHOLECYSTECTOMY WITH INTRAOPERATIVE CHOLANGIOGRAM;  Surgeon: [name], MD;  Location: [facility] OR;  Service: General;  Laterality: N/A;   COLONOSCOPY WITH ESOPHAGOGASTRODUODENOSCOPY (EGD)     HIP SURGERY Bilateral    labrum repair   HIP SURGERY Right 2023   remove hardware    SHOULDER SURGERY Bilateral    labrum repair   Patient Active Problem List   Diagnosis Date Noted   Acute reaction to situational stress 12/18/2023   Rubella non-immune status, antepartum 03/02/2023   Ehlers-Danlos disease 12/31/2022   Anxiety and depression 05/13/2019   Biliary dyskinesia 01/25/2016    PCP: Dr. Oneil Pinal  REFERRING PROVIDER: [name], CNM  REFERRING DIAG: 11/25/23  THERAPY DIAG:  Muscle weakness (generalized)  Other lack of coordination  Pelvic pain  Other abnormalities of gait and mobility  Urinary, incontinence, stress female  Rationale for Evaluation and Treatment: Rehabilitation  ONSET DATE: 11/25/23: referral date, but onset since childhood but postpartum incr. S/s (07/19/23)  SUBJECTIVE:  SUBJECTIVE STATEMENT: Pt reported UI was minimal this week and she trialed pelvic wand but had difficulty finding trigger points-PT encouraged pt to bring it next session. She still hasn't added band to clamshells because it's challenging, and R hip was sore. And pain can get up to a 7/10 and noticed a divot in R hip muscle are-looked like it was atrophied.   EVAL: URINARY FUNCTION: pt voids approx. Once an hour when drinking more water, on average every three hours. Pt has some pain when she voids, feels like she needs to go but doesn't always void when she gets to the toilet-not fully emptying. Pt has hx of yeast infections. Pt states stream is strong. Pt had epidurals and experienced urinary retention afterwards and it never fully recovered. SUI intermittently, before pregnancy (1-2x/time) and afterwards it was worse and now 1-2x/day again. Pt is breastfeeding baby, so she voids when she gets up at night. Hx of getting up about once/night prior to pregnancy. Pt used to wear  pads pre-pregnancy but not recently-more of a dribble vs. Gush when leaking.  BOWEL FUNCTION: typically once a day and type 3-4 about 75% of the time. Pt denied pain or hemorrhoids. No pressure or bulging.  CORE STABILITY: hx of chronic SIJ pain, hx of cholecystectomy in 2017, no MVAs or falls. Pt states her core feels weak postpartum, vaginal delivery. No tearing.  SEXUAL FUNCTION: pt reported pain with intercourse, initial and deep penetration. Stops intercourse from happening. Pt reported she does not feel that she has ever climaxed. She hasn't had pain during OBGYN exams in the past or tampon insertion, but hasn't attempted either postpartum.   Fluid intake: water in the am, Dr. Nunzio (one can/day), liquid IV with water, water (approx. 64 Oz.), sweet tea intermittently, no alcohol   PAIN:  Are you having pain? Yes 01/25/24 NPRS scale: 4/10 R hip pain and 2/10 mid back pain  Better: repositioning while lying down and sometimes when she lies down it hurts even worse before it settles down.  Aggravating factors: holding her baby  EVAL. Pelvic pain during intercourse: 6-8/10 with initial and deep intercourse and then achy for the next few hours.    PRECAUTIONS: None  RED FLAGS: None   WEIGHT BEARING RESTRICTIONS: No  FALLS:  Has patient fallen in last 6 months? No  OCCUPATION: occupational therapist-full-time   ACTIVITY LEVEL : on her feet all day at work, has not started a workout plan. Walks every night pushing stroller about 0.5 mile (at least).    PLOF: Independent  PATIENT GOALS: Feel like she has more control of pelvic floor, stop pain during intercourse, and stop leakage (less important).  PERTINENT HISTORY:  Ehlers-danlos syndrome, anxiety and depression, 10 joint surgeries (last one in 2023 2/2 ED), hx of migraine, seasonal allergies, joint pain Sexual abuse: No  BOWEL MOVEMENT: Pain with bowel movement: No Type of bowel movement:Frequency once daily Fully empty  rectum: No Leakage: No Pads: No Fiber supplement/laxative No  URINATION: Pain with urination: Yes Fully empty bladder: Nopainful when she doesn't fully empty Stream: Strong Urgency: Yes  Frequency: once an hour to every three hours Leakage: Urge to void, Coughing, Sneezing, Laughing, and Lifting Pads: No but she used to wear pads  INTERCOURSE:  Ability to have vaginal penetration Yes  Pain with intercourse: Initial Penetration, During Penetration, Deep Penetration, After Intercourse, and Pain Interrupts Intercourse DrynessYes  Climax: no [name] Scale: 3/3   PREGNANCY: Vaginal deliveries 1 Tearing No Episiotomy No C-section  deliveries 0 Currently pregnant No  PROLAPSE: None But felt pressure postpartum  OBJECTIVE:  Note: Objective measures were completed at Evaluation unless otherwise noted.   COGNITION: Overall cognitive status: Within functional limits for tasks assessed     SENSATION: Light touch: Appears intact   GAIT: Assistive device utilized: None Comments: decr. Trunk rot  POSTURE: convex L tx spine curve, FHP, pt reported anterior pelvic tilt when carrying baby but was more neutral today incr. Postural sway during R and L SLS.   LUMBARAROM/PROM: WNL, L rot and sidebending incr. LBP and with ext  A/PROM A/PROM  eval  Flexion   Extension   Right lateral flexion   Left lateral flexion   Right rotation   Left rotation    (Blank rows = not tested)  LOWER EXTREMITY ROM: WFL  but B L IR and ER end range caused hip pain.  Active ROM Right eval Left eval  Hip flexion    Hip extension    Hip abduction    Hip adduction    Hip internal rotation    Hip external rotation    Knee flexion    Knee extension    Ankle dorsiflexion    Ankle plantarflexion    Ankle inversion    Ankle eversion     (Blank rows = not tested)  LOWER EXTREMITY MMT:  MMT Right eval Left eval  Hip flexion with pain in hip flexors 4- 4-  Hip extension    Hip  abduction 3+ 3+  Hip adduction 4 3+  Hip internal rotation 4- 4-  Hip external rotation 4- 4-  Knee flexion 4- 4-  Knee extension 5 5  Ankle dorsiflexion 5 5  Ankle plantarflexion    Ankle inversion    Ankle eversion     (Blank rows = not tested) PALPATION: TTP over thoracolumbar junction, B glutes (R>L side), B hamstrings (lateral L and R but L pain worse), difficulty palpating for PFM externally, so performed internal muscle assessment. Decr. Glute tension in R glute vs. L glute.  PELVIC MMT:   MMT eval  Vaginal   Internal Anal Sphincter   External Anal Sphincter   Puborectalis   Diastasis Recti   (Blank rows = not tested)        TONE: Decr. Glute tone, and incr. Tone in LLE   PROLAPSE: none   TODAY'S TREATMENT:                                                                                                                              DATE: 01/25/24     MANUAL THERAPY: PT performed gentle cupping to R ant. Hip scar (incr. Fascial tension noted). After manual therapy: no incr. In pain. PT assessed sacrum, thoracolumbar junction, B hips and R hip scar. Sacral mobility has improved, B hip ROM WNL with pt reporting concordant pain with R hip IR. Pt reported decr. Pain after manual therapy and standing R knee  to chest stretch.   SELF CARE: PATIENT EDUCATION:  Education details:PT educated pt on how to use pelvic wand with pelvic model and pelvic wand (pelvic clock demo from 6 to 9 o'clock and 6 to 3 o'clock to track trigger points and to start with thick end first (superficial and the deep). PT educated pt on importance of cat/cow and child's pose, along with foam rolling before and after. MMT testing of B hip abd: 4-/5, improved since eval 3+/5. Person educated: Patient Education method: Explanation, Demonstration, and Handouts Education comprehension: verbalized understanding and needs further education  HOME EXERCISE PROGRAM: [id number]  medbridge.  ASSESSMENT:  CLINICAL IMPRESSION: Skilled session focused on decr. R hip fascial tension by using gentle cupping along scar, and then education to improve ability to perform pelvic wand trigger point release. The following impairments continue to be noted upon exam: back/SIJ pain, postural dysfunction (more neutral to post. Pelvic tilt) , decr. Strength (especially hips and core) impaired SLS balance, pelvic pain, SUI. Pt would continue to benefit from skilled PT to improve safety and decr. Pain during all ADLs.    OBJECTIVE IMPAIRMENTS: Abnormal gait, decreased balance, decreased coordination, decreased strength, improper body mechanics, postural dysfunction, and pain.   ACTIVITY LIMITATIONS: carrying, lifting, bending, standing, squatting, continence, toileting, locomotion level, and caring for others  PARTICIPATION LIMITATIONS: meal prep, cleaning, laundry, interpersonal relationship, and occupation  PERSONAL FACTORS: Past/current experiences, Profession, and 3+ comorbidities: see above are also affecting patient's functional outcome.   REHAB POTENTIAL: Good  CLINICAL DECISION MAKING: Evolving/moderate complexity  EVALUATION COMPLEXITY: Moderate   GOALS: Goals reviewed with patient? Yes  SHORT TERM GOALS: Target date: for all STGS: 01/05/24  Pt will be IND in HEP to improve pain, strength, coordination. Baseline: no HEP; 9/22: performing intermittently  Goal status: PARTIALLY MET  2.  Finish exam and write goals as indicated. Baseline: limited by time constraints Goal status: MET  3.  Pt will demo proper toileting posture to fully empty bladder. Baseline: unable to demo Goal status: MET  4.  Pt will demonstrated improved relaxation and contraction of PFM with coordination of breath to reduce urinary leakage to </=three/week. Baseline: 1-2x/day; 9/22: it was improving but this last week it's incr. To at least twice/week. Goal status: ON-GOING  5.  Pt will  demonstrate improved relaxation and contraction of pelvic floor muscles (PFM) with coordination of breath to decr. Pain to </=5/10 with intercourse with spouse. Baseline: 6-8/10 and stops intercourse. 9/22: 4-5/10 and 50/50 has to stop intercourse and still feels tense afterwards. Goal status: PARTIALLY MET   LONG TERM GOALS: Target date: 02/02/24   Pt will demonstrate proper lifting technique and biomechanics when carrying baby, pushing stroller and performing ADLs to decr. Pain. Baseline: unable to demo, incr. APT per pt when lifting baby Goal status: INITIAL  2.  Pt will demonstrate improved relaxation and contraction of pelvic floor muscles (PFM) with coordination of breath to decr. Pain to </=1/10 with intercourse with spouse. Baseline: 6-8/10 and stops intercourse Goal status: INITIAL  3.  Pt will demonstrated improved relaxation and contraction of PFM with coordination of breath to reduce urinary leakage to </=once/week. Baseline: 1-2x/day Goal status: INITIAL   PLAN: internal muscle assessment and scar mobilization prn, review HEP (ensure relaxation) and added to strengthening and paraspinal reactions.   PT FREQUENCY: 1x/week  PT DURATION: 8 weeks  PLANNED INTERVENTIONS: [id number]- PT Re-evaluation, 97110-Therapeutic exercises, 97530- Therapeutic activity, [id number]- Neuromuscular re-education, 97535- Self Care, [id number]- Manual therapy, [id number]- Gait training, [phone] (  1-2 muscles), [id number] (3+ muscles)- Dry Needling, Patient/Family education, Balance training, Taping, Joint mobilization, Spinal mobilization, Scar mobilization, Cryotherapy, Moist heat, and Biofeedback    Sundance Moise L, PT 01/25/2024, 8:11 AM  Delon Pinal, PT,DPT 01/25/24 8:11 AM Phone: [phone] Fax: [phone]

## 2024-02-04 ENCOUNTER — Ambulatory Visit

## 2024-02-04 ENCOUNTER — Other Ambulatory Visit: Payer: Self-pay

## 2024-02-04 DIAGNOSIS — N393 Stress incontinence (female) (male): Secondary | ICD-10-CM

## 2024-02-04 DIAGNOSIS — R102 Pelvic and perineal pain unspecified side: Secondary | ICD-10-CM

## 2024-02-04 DIAGNOSIS — R2689 Other abnormalities of gait and mobility: Secondary | ICD-10-CM

## 2024-02-04 DIAGNOSIS — M6281 Muscle weakness (generalized): Secondary | ICD-10-CM

## 2024-02-04 DIAGNOSIS — R278 Other lack of coordination: Secondary | ICD-10-CM

## 2024-02-04 NOTE — Therapy (Signed)
 OUTPATIENT PHYSICAL THERAPY FEMALE PELVIC TREATMENT   Patient Name: Alexa Grant MRN: [id number] DOB:09-04-1995, 28 y.o., female Today's Date: 02/04/2024  END OF SESSION:  PT End of Session - 01/12/24 0940     Visit Number 9   Number of Visits 9    Date for Recertification  02/06/24    Authorization Type BCBS    Progress Note Due on Visit 10    PT Start Time 1537    PT Stop Time 1617    PT Time Calculation (min) 40 min    Activity Tolerance Patient tolerated treatment well    Behavior During Therapy [facility] for tasks assessed/performed          Past Medical History:  Diagnosis Date   Biliary dyskinesia 01/25/2016   Family history of adverse reaction to anesthesia    dad gets sick with anesthesia   Gastric ulcer    GERD (gastroesophageal reflux disease)    takes Pantoprazole  daily   Headache    History of migraine    few yrs ago   Joint pain    PONV (postoperative nausea and vomiting)    Seasonal allergies    Tylenol  Sinus and Nasonex as needed   Supervision of high risk pregnancy, antepartum 12/10/2022              Clinical Staff    Provider      Office Location     [facility]    Dating     8/19 [redacted]w[redacted]d      Language     English    Anatomy US             Flu Vaccine     UTD    Genetic Screen     NIPS: low risk, XX      TDaP vaccine      offer    Hgb A1C or   GTT    Early :  Third trimester :       Covid    One booster         LAB RESULTS       Rhogam     A/Positive/-- (09/16 0849)     Blood Type    Vaccine for human papilloma virus (HPV) types 6, 11, 16, and 18 administered    Past Surgical History:  Procedure Laterality Date   CHOLECYSTECTOMY N/A 01/25/2016   Procedure: LAPAROSCOPIC CHOLECYSTECTOMY WITH INTRAOPERATIVE CHOLANGIOGRAM;  Surgeon: [name], MD;  Location: [facility] OR;  Service: General;  Laterality: N/A;   COLONOSCOPY WITH ESOPHAGOGASTRODUODENOSCOPY (EGD)     HIP SURGERY Bilateral    labrum repair   HIP SURGERY Right 2023   remove hardware    SHOULDER SURGERY Bilateral    labrum repair   Patient Active Problem List   Diagnosis Date Noted   Acute reaction to situational stress 12/18/2023   Rubella non-immune status, antepartum 03/02/2023   Ehlers-Danlos disease 12/31/2022   Anxiety and depression 05/13/2019   Biliary dyskinesia 01/25/2016    PCP: Dr. Oneil Pinal  REFERRING PROVIDER: [name], CNM  REFERRING DIAG: 11/25/23  THERAPY DIAG:  Muscle weakness (generalized)  Other lack of coordination  Pelvic pain  Other abnormalities of gait and mobility  Urinary, incontinence, stress female  Rationale for Evaluation and Treatment: Rehabilitation  ONSET DATE: 11/25/23: referral date, but onset since childhood but postpartum incr. S/s (07/19/23)  SUBJECTIVE:  SUBJECTIVE STATEMENT: Pt reported she got an appt with Dr. Ezzard next Tuesday. The hip has been the same. Pt has not had intercourse recently, so unable to tell if PFM tension present. No leakage this last week. HEP is going well but still challenging.   EVAL: URINARY FUNCTION: pt voids approx. Once an hour when drinking more water, on average every three hours. Pt has some pain when she voids, feels like she needs to go but doesn't always void when she gets to the toilet-not fully emptying. Pt has hx of yeast infections. Pt states stream is strong. Pt had epidurals and experienced urinary retention afterwards and it never fully recovered. SUI intermittently, before pregnancy (1-2x/time) and afterwards it was worse and now 1-2x/day again. Pt is breastfeeding baby, so she voids when she gets up at night. Hx of getting up about once/night prior to pregnancy. Pt used to wear pads pre-pregnancy but not recently-more of a dribble vs. Gush when leaking.  BOWEL FUNCTION: typically once a  day and type 3-4 about 75% of the time. Pt denied pain or hemorrhoids. No pressure or bulging.  CORE STABILITY: hx of chronic SIJ pain, hx of cholecystectomy in 2017, no MVAs or falls. Pt states her core feels weak postpartum, vaginal delivery. No tearing.  SEXUAL FUNCTION: pt reported pain with intercourse, initial and deep penetration. Stops intercourse from happening. Pt reported she does not feel that she has ever climaxed. She hasn't had pain during OBGYN exams in the past or tampon insertion, but hasn't attempted either postpartum.   Fluid intake: water in the am, Dr. Nunzio (one can/day), liquid IV with water, water (approx. 64 Oz.), sweet tea intermittently, no alcohol   PAIN:  Are you having pain? Yes 02/04/24 NPRS scale: 3/10 R hip pain and 1/10 mid back pain  Better: repositioning while lying down and sometimes when she lies down it hurts even worse before it settles down.  Aggravating factors: holding her baby  EVAL. Pelvic pain during intercourse: 6-8/10 with initial and deep intercourse and then achy for the next few hours.    PRECAUTIONS: None  RED FLAGS: None   WEIGHT BEARING RESTRICTIONS: No  FALLS:  Has patient fallen in last 6 months? No  OCCUPATION: occupational therapist-full-time   ACTIVITY LEVEL : on her feet all day at work, has not started a workout plan. Walks every night pushing stroller about 0.5 mile (at least).    PLOF: Independent  PATIENT GOALS: Feel like she has more control of pelvic floor, stop pain during intercourse, and stop leakage (less important).  PERTINENT HISTORY:  Ehlers-danlos syndrome, anxiety and depression, 10 joint surgeries (last one in 2023 2/2 ED), hx of migraine, seasonal allergies, joint pain Sexual abuse: No  BOWEL MOVEMENT: Pain with bowel movement: No Type of bowel movement:Frequency once daily Fully empty rectum: No Leakage: No Pads: No Fiber supplement/laxative No  URINATION: Pain with urination: Yes Fully  empty bladder: Nopainful when she doesn't fully empty Stream: Strong Urgency: Yes  Frequency: once an hour to every three hours Leakage: Urge to void, Coughing, Sneezing, Laughing, and Lifting Pads: No but she used to wear pads  INTERCOURSE:  Ability to have vaginal penetration Yes  Pain with intercourse: Initial Penetration, During Penetration, Deep Penetration, After Intercourse, and Pain Interrupts Intercourse DrynessYes  Climax: no [name] Scale: 3/3   PREGNANCY: Vaginal deliveries 1 Tearing No Episiotomy No C-section deliveries 0 Currently pregnant No  PROLAPSE: None But felt pressure postpartum  OBJECTIVE:  Note: Objective measures  were completed at Evaluation unless otherwise noted.   COGNITION: Overall cognitive status: Within functional limits for tasks assessed     SENSATION: Light touch: Appears intact   GAIT: Assistive device utilized: None Comments: decr. Trunk rot  POSTURE: convex L tx spine curve, FHP, pt reported anterior pelvic tilt when carrying baby but was more neutral today incr. Postural sway during R and L SLS.   LUMBARAROM/PROM: WNL, L rot and sidebending incr. LBP and with ext  A/PROM A/PROM  eval  Flexion   Extension   Right lateral flexion   Left lateral flexion   Right rotation   Left rotation    (Blank rows = not tested)  LOWER EXTREMITY ROM: WFL  but B L IR and ER end range caused hip pain.  Active ROM Right eval Left eval  Hip flexion    Hip extension    Hip abduction    Hip adduction    Hip internal rotation    Hip external rotation    Knee flexion    Knee extension    Ankle dorsiflexion    Ankle plantarflexion    Ankle inversion    Ankle eversion     (Blank rows = not tested)  LOWER EXTREMITY MMT:  MMT Right eval Left eval  Hip flexion with pain in hip flexors 4- 4-  Hip extension    Hip abduction 3+ 3+  Hip adduction 4 3+  Hip internal rotation 4- 4-  Hip external rotation 4- 4-  Knee flexion 4-  4-  Knee extension 5 5  Ankle dorsiflexion 5 5  Ankle plantarflexion    Ankle inversion    Ankle eversion     (Blank rows = not tested) PALPATION: TTP over thoracolumbar junction, B glutes (R>L side), B hamstrings (lateral L and R but L pain worse), difficulty palpating for PFM externally, so performed internal muscle assessment. Decr. Glute tension in R glute vs. L glute.  PELVIC MMT:   MMT eval  Vaginal   Internal Anal Sphincter   External Anal Sphincter   Puborectalis   Diastasis Recti   (Blank rows = not tested)        TONE: Decr. Glute tone, and incr. Tone in LLE   PROLAPSE: none   TODAY'S TREATMENT:                                                                                                                              DATE: 02/04/24   THEREX: Access Code: [email or web address] URL: [email or web address] Date: 02/04/2024 Prepared by: Delon Pinal  Exercises - Obliques  - 1 x daily - 3 x weekly - 3 sets - 10 reps -Sidelying obliques with bottom LE straight and the flexed to meet contralat. Elbow-R hip pain, ceased. - Side Plank on Knees  - 1 x daily - 3 x weekly - 1 sets - 3 reps - 5-10 hold - Tall Kneeling Chest Press with Resistance  - 1 x  daily - 3 x weekly - 3 sets - 10 reps -90/90 pullback to perform prior to dead bug to decr. Hip flexion pain. -dead bugs x 5 reps. Cues and demo for proper technique. S for safety. Hip pain (R) was limiting.   MANUAL THERAPY: PT performed assessment to B hip joints (ROM, PAMs, strength) R hip abd: 4+/5, L hip abd: 4+/5. R SIJ still hypomobile and TTP over R glutes, hip rotators, coccyx musculature and B paraspinals and B QLs. Contract-relax technique with knee to chest and pushing into hip ext x 3 reps/side.   SELF CARE: PATIENT EDUCATION:  Education details:PT educated pt on improving core strength (obliques to assist in decr. Back pain), notify PT re: what her ortho surgeon says after 10/28 appt re: R hip pain at  past surgical site. PT educated pt on progress (improved hip strength, and no leakage) and to adjust HEP based on R hip pain.  Person educated: Patient Education method: Explanation, Demonstration, and Handouts Education comprehension: verbalized understanding and needs further education  HOME EXERCISE PROGRAM: [id number] medbridge.  ASSESSMENT:  CLINICAL IMPRESSION: Skilled session focused on decr. R hip pain and improving oblique and TrA strength to decr. Back pain. Pt seeing ortho surgeon 10/28 to discuss R hip pain at surgical site despite pt being stronger since start of PHPT. Pt demonstrated progress as she did not leak this last week and hip strength is improved. Has not attempted intercourse recently, so now aware if this is still painful. The following impairments continue to be noted upon exam: back/SIJ pain, postural dysfunction (more neutral to post. Pelvic tilt) , decr. Strength (especially hips and core) impaired SLS balance, pelvic pain, SUI. Pt would continue to benefit from skilled PT to improve safety and decr. Pain during all ADLs.  All ongoing short term and long goals will be moved to new POC.   OBJECTIVE IMPAIRMENTS: Abnormal gait, decreased balance, decreased coordination, decreased strength, improper body mechanics, postural dysfunction, and pain.   ACTIVITY LIMITATIONS: carrying, lifting, bending, standing, squatting, continence, toileting, locomotion level, and caring for others  PARTICIPATION LIMITATIONS: meal prep, cleaning, laundry, interpersonal relationship, and occupation  PERSONAL FACTORS: Past/current experiences, Profession, and 3+ comorbidities: see above are also affecting patient's functional outcome.   REHAB POTENTIAL: Good  CLINICAL DECISION MAKING: Evolving/moderate complexity  EVALUATION COMPLEXITY: Moderate   GOALS: Goals reviewed with patient? Yes  SHORT TERM GOALS: Target date: for all STGS: 01/05/24  Pt will be IND in HEP to improve pain,  strength, coordination. Baseline: no HEP; 9/22: performing intermittently  Goal status: PARTIALLY MET  2.  Finish exam and write goals as indicated. Baseline: limited by time constraints Goal status: MET  3.  Pt will demo proper toileting posture to fully empty bladder. Baseline: unable to demo Goal status: MET  4.  Pt will demonstrated improved relaxation and contraction of PFM with coordination of breath to reduce urinary leakage to </=three/week. Baseline: 1-2x/day; 9/22: it was improving but this last week it's incr. To at least twice/week. Goal status: ON-GOING  5.  Pt will demonstrate improved relaxation and contraction of pelvic floor muscles (PFM) with coordination of breath to decr. Pain to </=5/10 with intercourse with spouse. Baseline: 6-8/10 and stops intercourse. 9/22: 4-5/10 and 50/50 has to stop intercourse and still feels tense afterwards. Goal status: PARTIALLY MET   LONG TERM GOALS: Target date: 02/02/24   Pt will demonstrate proper lifting technique and biomechanics when carrying baby, pushing stroller and performing ADLs to decr. Pain. Baseline:  unable to demo, incr. APT per pt when lifting baby. 10/23: R hip pain and back pain still present Goal status: ON-GOING  2.  Pt will demonstrate improved relaxation and contraction of pelvic floor muscles (PFM) with coordination of breath to decr. Pain to </=1/10 with intercourse with spouse. Baseline: 6-8/10 and stops intercourse. 10/23: has not attempted recently Goal status: ON-GOING  3.  Pt will demonstrated improved relaxation and contraction of PFM with coordination of breath to reduce urinary leakage to </=once/week. Baseline: 1-2x/day, 10/23: no leakage this last week (but occurred the week prior) Goal status: PARTIALLY MET   PLAN: progress core strength, internal muscle assessment and scar mobilization prn, review HEP (ensure relaxation) and added to strengthening and paraspinal reactions.   PT FREQUENCY:  every other week  PT DURATION: 8 weeks  PLANNED INTERVENTIONS: 97164- PT Re-evaluation, 97110-Therapeutic exercises, 97530- Therapeutic activity, 97112- Neuromuscular re-education, 97535- Self Care, 02859- Manual therapy, [phone]- Gait training, [phone] (1-2 muscles), 20561 (3+ muscles)- Dry Needling, Patient/Family education, Balance training, Taping, Joint mobilization, Spinal mobilization, Scar mobilization, Cryotherapy, Moist heat, and Biofeedback    Gerilynn Mccullars L, PT 02/04/2024, 4:40 PM  Delon Pinal, PT,DPT 02/04/24 4:40 PM Phone: [phone] Fax: [phone]

## 2024-02-15 NOTE — Patient Instructions (Signed)
 Postpartum Emotions This video will explore the intense feelings that are common after childbirth, and look at the key differences between the baby blues and postpartum depression. To view the content, go to this web address: [email or web address]  This video will expire on: 03/25/2025. If you need access to this video following this date, please reach out to the healthcare provider who assigned it to you. This information is not intended to replace advice given to you by your health care provider. Make sure you discuss any questions you have with your health care provider. Elsevier Patient Education  2024 [employer].

## 2024-02-16 ENCOUNTER — Encounter: Payer: Self-pay | Admitting: Certified Nurse Midwife

## 2024-02-16 ENCOUNTER — Telehealth (INDEPENDENT_AMBULATORY_CARE_PROVIDER_SITE_OTHER): Admitting: Certified Nurse Midwife

## 2024-02-16 DIAGNOSIS — F32A Depression, unspecified: Secondary | ICD-10-CM

## 2024-02-16 DIAGNOSIS — F418 Other specified anxiety disorders: Secondary | ICD-10-CM

## 2024-02-16 DIAGNOSIS — F53 Postpartum depression: Secondary | ICD-10-CM | POA: Diagnosis not present

## 2024-02-16 MED ORDER — SERTRALINE HCL 100 MG PO TABS: 150.0000 mg | ORAL_TABLET | Freq: Every day | ORAL | 0 refills | Status: AC

## 2024-02-16 NOTE — Progress Notes (Signed)
 Virtual Visit via Video Note  I connected with Alexa Grant on 02/16/24 at  10:35 AM EST by a video enabled telemedicine application and verified that I am speaking with the correct person using two identifiers.  Location: Patient: Alexa Grant: [facility] office   I discussed the limitations of evaluation and management by telemedicine and the availability of in person appointments. The patient expressed understanding and agreed to proceed.    History of Present Illness:   Alexa Grant is a 28 y.o. G16P1001 female who presents for medication management follow up. She attempted to wean off Zoloft  but found a significant increase in her symptoms. She is now on Zoloft  150mg  with good response. She feels her anxiety is is improved though endorses increased stressors with relationship & work/life balance. Her husband is employed by [employer] so is not currently getting a paycheck. EDPS score is 10 today which is decreased from last screen 10/2 Established with a counselor & having weekly couples counseling visits.      The following portions of the patient's history were reviewed and updated as appropriate: allergies, current medications, past family history, past medical history, past social history, past surgical history, and problem list.   Observations/Objective:   currently breastfeeding. Gen App: NAD Psych: normal speech, affect. Good mood.        02/16/2024   10:27 AM 01/14/2024    9:56 AM 12/17/2023    3:59 PM 09/02/2023   11:07 AM 08/04/2023   10:56 AM  Edinburgh Postnatal Depression Scale Screening Tool  I have been able to laugh and see the funny side of things. 1 0  1  0  0   I have looked forward with enjoyment to things. 0 1  1  0  0   I have blamed myself unnecessarily when things went wrong. 2 3  3  2  2    I have been anxious or worried for no good reason. 2 2  3  2  1    I have felt scared or panicky for no good reason. 1 2  3   0  1   Things have been  getting on top of me. 2 2  3  2  2    I have been so unhappy that I have had difficulty sleeping. 1 2  2   0  0   I have felt sad or miserable. 1 1  2  1  1    I have been so unhappy that I have been crying. 0 1  0  0  0   The thought of harming myself has occurred to me. 0 0  1  0  0   Edinburgh Postnatal Depression Scale Total 10 14 19 7  7       Data saved with a previous flowsheet row definition         Assessment and Plan:   1. Postpartum depression associated with first pregnancy (Primary)  2. Anxiety and depression  Continue Zoloft  at 150mg  daily, follow up in 3 months.    Follow Up Instructions:     I discussed the assessment and treatment plan with the patient. The patient was provided an opportunity to ask questions and all were answered. The patient agreed with the plan and demonstrated an understanding of the instructions.   The patient was advised to call back or seek an in-person evaluation if the symptoms worsen or if the condition fails to improve as anticipated.   Navin Dogan L  Jayne, CNM

## 2024-02-18 ENCOUNTER — Other Ambulatory Visit: Payer: Self-pay

## 2024-02-18 ENCOUNTER — Ambulatory Visit: Attending: Certified Nurse Midwife

## 2024-02-18 DIAGNOSIS — R2689 Other abnormalities of gait and mobility: Secondary | ICD-10-CM | POA: Diagnosis present

## 2024-02-18 DIAGNOSIS — R102 Pelvic and perineal pain unspecified side: Secondary | ICD-10-CM | POA: Diagnosis present

## 2024-02-18 DIAGNOSIS — N393 Stress incontinence (female) (male): Secondary | ICD-10-CM | POA: Diagnosis present

## 2024-02-18 DIAGNOSIS — R278 Other lack of coordination: Secondary | ICD-10-CM | POA: Insufficient documentation

## 2024-02-18 DIAGNOSIS — M6281 Muscle weakness (generalized): Secondary | ICD-10-CM | POA: Diagnosis present

## 2024-02-18 NOTE — Therapy (Signed)
 OUTPATIENT PHYSICAL THERAPY FEMALE PELVIC TREATMENT/progress note  Progress Note Reporting Period 12/08/23 to 02/18/24  See note below for Objective Data and Assessment of Progress/Goals.       Patient Name: Alexa Grant MRN: [id number] DOB:[birth year], 28 y.o., female Today's Date: 02/18/2024  END OF SESSION:  PT End of Session - 01/12/24 0940     Visit Number 10   Number of Visits 12   Date for Recertification  04/04/24    Authorization Type [employer]    Progress Note Due on Visit 10    PT Start Time 0805    PT Stop Time 0845    PT Time Calculation (min) 40 min    Activity Tolerance Patient tolerated treatment well    Behavior During Therapy [facility] for tasks assessed/performed          Past Medical History:  Diagnosis Date   Biliary dyskinesia 01/25/2016   Family history of adverse reaction to anesthesia    dad gets sick with anesthesia   Gastric ulcer    GERD (gastroesophageal reflux disease)    takes Pantoprazole  daily   Headache    History of migraine    few yrs ago   Joint pain    PONV (postoperative nausea and vomiting)    Seasonal allergies    Tylenol  Sinus and Nasonex as needed   Supervision of high risk pregnancy, antepartum 12/10/2022              Clinical Staff    Provider      Office Location     [facility]    Dating     8/19 [redacted]w[redacted]d      Language     English    Anatomy US             Flu Vaccine     UTD    Genetic Screen     NIPS: low risk, XX      TDaP vaccine      offer    Hgb A1C or   GTT    Early :  Third trimester :       Covid    One booster         LAB RESULTS       Rhogam     A/Positive/-- (09/16 0849)     Blood Type    Vaccine for human papilloma virus (HPV) types 6, 11, 16, and 18 administered    Past Surgical History:  Procedure Laterality Date   CHOLECYSTECTOMY N/A 01/25/2016   Procedure: LAPAROSCOPIC CHOLECYSTECTOMY WITH INTRAOPERATIVE CHOLANGIOGRAM;  Surgeon: [name], MD;  Location: [facility] OR;  Service: General;  Laterality: N/A;    COLONOSCOPY WITH ESOPHAGOGASTRODUODENOSCOPY (EGD)     HIP SURGERY Bilateral    labrum repair   HIP SURGERY Right 2023   remove hardware   SHOULDER SURGERY Bilateral    labrum repair   Patient Active Problem List   Diagnosis Date Noted   Acute reaction to situational stress 12/18/2023   Rubella non-immune status, antepartum 03/02/2023   Ehlers-Danlos disease 12/31/2022   Anxiety and depression 05/13/2019   Biliary dyskinesia 01/25/2016    PCP: Dr. Oneil Pinal  REFERRING PROVIDER: [name], CNM  REFERRING DIAG: 11/25/23  THERAPY DIAG:  Muscle weakness (generalized)  Other lack of coordination  Pelvic pain  Other abnormalities of gait and mobility  Urinary, incontinence, stress female  Rationale for Evaluation and Treatment: Rehabilitation  ONSET DATE: 11/25/23: referral date, but onset since childhood but postpartum incr.  S/s (07/19/23)  SUBJECTIVE:                                                                                                                                                                                           SUBJECTIVE STATEMENT: Pt reported she went to her ortho surgeon and was positive for R hip impingement and inflammation. She had x-rays that were clear and site is healed. She is cleared to continue PT. Pt hasn't been as consistent with HEP 2/2 schedule, 2-3x/week. Leakage has been ok, very painful with intercourse.     EVAL: URINARY FUNCTION: pt voids approx. Once an hour when drinking more water, on average every three hours. Pt has some pain when she voids, feels like she needs to go but doesn't always void when she gets to the toilet-not fully emptying. Pt has hx of yeast infections. Pt states stream is strong. Pt had epidurals and experienced urinary retention afterwards and it never fully recovered. SUI intermittently, before pregnancy (1-2x/time) and afterwards it was worse and now 1-2x/day again. Pt is breastfeeding baby, so she voids  when she gets up at night. Hx of getting up about once/night prior to pregnancy. Pt used to wear pads pre-pregnancy but not recently-more of a dribble vs. Gush when leaking.  BOWEL FUNCTION: typically once a day and type 3-4 about 75% of the time. Pt denied pain or hemorrhoids. No pressure or bulging.  CORE STABILITY: hx of chronic SIJ pain, hx of cholecystectomy in 2017, no MVAs or falls. Pt states her core feels weak postpartum, vaginal delivery. No tearing.  SEXUAL FUNCTION: pt reported pain with intercourse, initial and deep penetration. Stops intercourse from happening. Pt reported she does not feel that she has ever climaxed. She hasn't had pain during OBGYN exams in the past or tampon insertion, but hasn't attempted either postpartum.   Fluid intake: water in the am, Dr. Nunzio (one can/day), liquid IV with water, water (approx. 64 Oz.), sweet tea intermittently, no alcohol   PAIN:  Are you having pain? Yes 02/18/24 NPRS scale: 2/10 R hip pain and 2/10 mid back pain but kicks up with movement.  Better: repositioning while lying down and sometimes when she lies down it hurts even worse before it settles down.  Aggravating factors: holding her baby  EVAL. Pelvic pain during intercourse: 6-8/10 with initial and deep intercourse and then achy for the next few hours.    PRECAUTIONS: None  RED FLAGS: None   WEIGHT BEARING RESTRICTIONS: No  FALLS:  Has patient fallen in last 6 months? No  OCCUPATION: occupational therapist-full-time   ACTIVITY LEVEL : on her feet all  day at work, has not started a workout plan. Walks every night pushing stroller about 0.5 mile (at least).    PLOF: Independent  PATIENT GOALS: Feel like she has more control of pelvic floor, stop pain during intercourse, and stop leakage (less important).  PERTINENT HISTORY:  Ehlers-danlos syndrome, anxiety and depression, 10 joint surgeries (last one in 2023 2/2 ED), hx of migraine, seasonal allergies, joint  pain Sexual abuse: No  BOWEL MOVEMENT: Pain with bowel movement: No Type of bowel movement:Frequency once daily Fully empty rectum: No Leakage: No Pads: No Fiber supplement/laxative No  URINATION: Pain with urination: Yes Fully empty bladder: Nopainful when she doesn't fully empty Stream: Strong Urgency: Yes  Frequency: once an hour to every three hours Leakage: Urge to void, Coughing, Sneezing, Laughing, and Lifting Pads: No but she used to wear pads  INTERCOURSE:  Ability to have vaginal penetration Yes  Pain with intercourse: Initial Penetration, During Penetration, Deep Penetration, After Intercourse, and Pain Interrupts Intercourse DrynessYes  Climax: no [name] Scale: 3/3   PREGNANCY: Vaginal deliveries 1 Tearing No Episiotomy No C-section deliveries 0 Currently pregnant No  PROLAPSE: None But felt pressure postpartum  OBJECTIVE:  Note: Objective measures were completed at Evaluation unless otherwise noted.   COGNITION: Overall cognitive status: Within functional limits for tasks assessed     SENSATION: Light touch: Appears intact   GAIT: Assistive device utilized: None Comments: decr. Trunk rot  POSTURE: convex L tx spine curve, FHP, pt reported anterior pelvic tilt when carrying baby but was more neutral today incr. Postural sway during R and L SLS.   LUMBARAROM/PROM: WNL, L rot and sidebending incr. LBP and with ext  A/PROM A/PROM  eval  Flexion   Extension   Right lateral flexion   Left lateral flexion   Right rotation   Left rotation    (Blank rows = not tested)  LOWER EXTREMITY ROM: WFL  but B L IR and ER end range caused hip pain.  Active ROM Right eval Left eval  Hip flexion    Hip extension    Hip abduction    Hip adduction    Hip internal rotation    Hip external rotation    Knee flexion    Knee extension    Ankle dorsiflexion    Ankle plantarflexion    Ankle inversion    Ankle eversion     (Blank rows = not  tested)  LOWER EXTREMITY MMT:  MMT Right eval Left eval  Hip flexion with pain in hip flexors 4- 4-  Hip extension    Hip abduction 3+ 3+  Hip adduction 4 3+  Hip internal rotation 4- 4-  Hip external rotation 4- 4-  Knee flexion 4- 4-  Knee extension 5 5  Ankle dorsiflexion 5 5  Ankle plantarflexion    Ankle inversion    Ankle eversion     (Blank rows = not tested) PALPATION: TTP over thoracolumbar junction, B glutes (R>L side), B hamstrings (lateral L and R but L pain worse), difficulty palpating for PFM externally, so performed internal muscle assessment. Decr. Glute tension in R glute vs. L glute.  PELVIC MMT:   MMT eval  Vaginal   Internal Anal Sphincter   External Anal Sphincter   Puborectalis   Diastasis Recti   (Blank rows = not tested)        TONE: Decr. Glute tone, and incr. Tone in LLE   PROLAPSE: none   TODAY'S TREATMENT:  DATE: 02/18/24    MANUAL THERAPY: PT performed trigger point release with pt's pelvic wand with lubrication (and pt consented), used wand externally and internally on R and L PFM. R sided pain 5/10 and then discomfort afterwards, L sided pain 2/10 and then released. PT instructed pt to perform diaphragmatic breathing during trigger point release.  SELF CARE: PATIENT EDUCATION:  Education details:PT educated pt on detailed information on how to use vibrating pelvic wand externally and internally. Using light pressure (similar to checking tomato for ripeness), using thicker end of wand to first knuckle length and then deeper to flower emblem on wand and to use thinner portion of wand to reach coccyx area muscles. Provided pt with lubricant samples to utilize. Encourage pt to be semi recumbent and that partner could participate as well. Pt performed guided body scan meditation at end of session to decr. Tension. No  tension afterwards. Person educated: Patient Education method: Explanation, Demonstration, and Handouts Education comprehension: verbalized understanding and needs further education  HOME EXERCISE PROGRAM: [id number] [employer].  ASSESSMENT:  CLINICAL IMPRESSION: Skilled session focused on pelvic wand education and use to decr. PFM tension. Pt demonstrated progress as she reported decr. Pelvic pain after trigger point release. The following impairments continue to be noted upon exam: back/SIJ pain, postural dysfunction (more neutral to post. Pelvic tilt) , decr. Strength (especially hips and core) impaired SLS balance, pelvic pain, SUI. Pt would continue to benefit from skilled PT to improve safety and decr. Pain during all ADLs.  All ongoing short term and long goals will be moved to new POC.   OBJECTIVE IMPAIRMENTS: Abnormal gait, decreased balance, decreased coordination, decreased strength, improper body mechanics, postural dysfunction, and pain.   ACTIVITY LIMITATIONS: carrying, lifting, bending, standing, squatting, continence, toileting, locomotion level, and caring for others  PARTICIPATION LIMITATIONS: meal prep, cleaning, laundry, interpersonal relationship, and occupation  PERSONAL FACTORS: Past/current experiences, Profession, and 3+ comorbidities: see above are also affecting patient's functional outcome.   REHAB POTENTIAL: Good  CLINICAL DECISION MAKING: Evolving/moderate complexity  EVALUATION COMPLEXITY: Moderate   GOALS: Goals reviewed with patient? Yes  SHORT TERM GOALS: Target date: for all STGS: 01/05/24. For STGs: 03/03/24  Pt will be IND in HEP to improve pain, strength, coordination. Baseline: no HEP; 9/22: performing intermittently  Goal status: PARTIALLY MET  2.  Finish exam and write goals as indicated. Baseline: limited by time constraints Goal status: MET  3.  Pt will demo proper toileting posture to fully empty bladder. Baseline: unable to demo Goal  status: MET  4.  Pt will demonstrated improved relaxation and contraction of PFM with coordination of breath to reduce urinary leakage to </=three/week. Baseline: 1-2x/day; 9/22: it was improving but this last week it's incr. To at least twice/week. Goal status: ON-GOING  5.  Pt will demonstrate improved relaxation and contraction of pelvic floor muscles (PFM) with coordination of breath to decr. Pain to </=5/10 with intercourse with spouse. Baseline: 6-8/10 and stops intercourse. 9/22: 4-5/10 and 50/50 has to stop intercourse and still feels tense afterwards. Goal status: PARTIALLY MET   LONG TERM GOALS: Target date: 02/02/24. For LTGs: 03/31/24   Pt will demonstrate proper lifting technique and biomechanics when carrying baby, pushing stroller and performing ADLs to decr. Pain. Baseline: unable to demo, incr. APT per pt when lifting baby. 10/23: R hip pain and back pain still present Goal status: ON-GOING  2.  Pt will demonstrate improved relaxation and contraction of pelvic floor muscles (PFM) with coordination of breath to  decr. Pain to </=1/10 with intercourse with spouse. Baseline: 6-8/10 and stops intercourse. 10/23: has not attempted recently Goal status: ON-GOING  3.  Pt will demonstrated improved relaxation and contraction of PFM with coordination of breath to reduce urinary leakage to </=once/week. Baseline: 1-2x/day, 10/23: no leakage this last week (but occurred the week prior) Goal status: PARTIALLY MET   PLAN: progress core strength, internal muscle assessment and scar mobilization prn, review HEP (ensure relaxation) and added to strengthening and paraspinal reactions.   PT FREQUENCY: every other week  PT DURATION: 8 weeks  PLANNED INTERVENTIONS: 97164- PT Re-evaluation, 97110-Therapeutic exercises, 97530- Therapeutic activity, 97112- Neuromuscular re-education, 97535- Self Care, [id number]- Manual therapy, [phone]- Gait training, [phone] (1-2 muscles), 20561 (3+ muscles)- Dry  Needling, Patient/Family education, Balance training, Taping, Joint mobilization, Spinal mobilization, Scar mobilization, Cryotherapy, Moist heat, and Biofeedback    Asah Lamay L, PT 02/18/2024, 8:09 AM  Delon Pinal, PT,DPT 02/18/24 8:09 AM Phone: [phone] Fax: [phone]

## 2024-02-23 ENCOUNTER — Telehealth: Payer: Self-pay | Admitting: Obstetrics & Gynecology

## 2024-02-23 ENCOUNTER — Other Ambulatory Visit: Payer: Self-pay | Admitting: Certified Nurse Midwife

## 2024-02-23 ENCOUNTER — Encounter: Payer: Self-pay | Admitting: Certified Nurse Midwife

## 2024-02-23 MED ORDER — DICLOXACILLIN SODIUM 500 MG PO CAPS
500.0000 mg | ORAL_CAPSULE | Freq: Four times a day (QID) | ORAL | 0 refills | Status: AC
Start: 2024-02-23 — End: 2024-03-01

## 2024-02-23 MED ORDER — FLUCONAZOLE 150 MG PO TABS: 150.0000 mg | ORAL_TABLET | ORAL | 0 refills | Status: AC

## 2024-02-23 NOTE — Telephone Encounter (Signed)
 Reached out to Alexa Grant to schedule an appt for possible mastitis.  Left message for Alexa Grant to call back.  Dr. Starla has an opening on Wed. Nov. 12th at 10:45.

## 2024-02-25 ENCOUNTER — Encounter

## 2024-03-03 ENCOUNTER — Other Ambulatory Visit: Payer: Self-pay

## 2024-03-03 ENCOUNTER — Ambulatory Visit

## 2024-03-03 DIAGNOSIS — N393 Stress incontinence (female) (male): Secondary | ICD-10-CM

## 2024-03-03 DIAGNOSIS — R2689 Other abnormalities of gait and mobility: Secondary | ICD-10-CM

## 2024-03-03 DIAGNOSIS — R278 Other lack of coordination: Secondary | ICD-10-CM

## 2024-03-03 DIAGNOSIS — M6281 Muscle weakness (generalized): Secondary | ICD-10-CM | POA: Diagnosis not present

## 2024-03-03 DIAGNOSIS — R102 Pelvic and perineal pain unspecified side: Secondary | ICD-10-CM

## 2024-03-03 NOTE — Therapy (Signed)
 OUTPATIENT PHYSICAL THERAPY FEMALE PELVIC TREATMENT     Patient Name: Alexa Grant MRN: [id number] DOB:1995-05-03, 28 y.o., female Today's Date: 03/03/2024  END OF SESSION:  PT End of Session - 01/12/24 0940     Visit Number 11   Number of Visits 12   Date for Recertification  04/04/24    Authorization Type [employer]    Progress Note Due on Visit 10    PT Start Time 0801    PT Stop Time 0842    PT Time Calculation (min) 41 min    Activity Tolerance Patient tolerated treatment well    Behavior During Therapy [facility] for tasks assessed/performed          Past Medical History:  Diagnosis Date   Biliary dyskinesia 01/25/2016   Family history of adverse reaction to anesthesia    dad gets sick with anesthesia   Gastric ulcer    GERD (gastroesophageal reflux disease)    takes Pantoprazole  daily   Headache    History of migraine    few yrs ago   Joint pain    PONV (postoperative nausea and vomiting)    Seasonal allergies    Tylenol  Sinus and Nasonex as needed   Supervision of high risk pregnancy, antepartum 12/10/2022              Clinical Staff    Provider      Office Location     [facility]    Dating     8/19 [redacted]w[redacted]d      Language     English    Anatomy US             Flu Vaccine     UTD    Genetic Screen     NIPS: low risk, XX      TDaP vaccine      offer    Hgb A1C or   GTT    Early :  Third trimester :       Covid    One booster         LAB RESULTS       Rhogam     A/Positive/-- (09/16 0849)     Blood Type    Vaccine for human papilloma virus (HPV) types 6, 11, 16, and 18 administered    Past Surgical History:  Procedure Laterality Date   CHOLECYSTECTOMY N/A 01/25/2016   Procedure: LAPAROSCOPIC CHOLECYSTECTOMY WITH INTRAOPERATIVE CHOLANGIOGRAM;  Surgeon: [name], MD;  Location: [facility] OR;  Service: General;  Laterality: N/A;   COLONOSCOPY WITH ESOPHAGOGASTRODUODENOSCOPY (EGD)     HIP SURGERY Bilateral    labrum repair   HIP SURGERY Right 2023   remove hardware    SHOULDER SURGERY Bilateral    labrum repair   Patient Active Problem List   Diagnosis Date Noted   Acute reaction to situational stress 12/18/2023   Rubella non-immune status, antepartum 03/02/2023   Ehlers-Danlos disease 12/31/2022   Anxiety and depression 05/13/2019   Biliary dyskinesia 01/25/2016    PCP: Dr. Oneil Pinal  REFERRING PROVIDER: [name], CNM  REFERRING DIAG: 11/25/23  THERAPY DIAG:  Muscle weakness (generalized)  Other lack of coordination  Pelvic pain  Other abnormalities of gait and mobility  Urinary, incontinence, stress female  Rationale for Evaluation and Treatment: Rehabilitation  ONSET DATE: 11/25/23: referral date, but onset since childhood but postpartum incr. S/s (07/19/23)  SUBJECTIVE:  SUBJECTIVE STATEMENT: Pt reported no leakage and is able to use the pelvic wand more consistently (q 3-4 days). Has not had intercourse 2/2 libido and intimacy. Pt hasn't noticed any difference in hip pain.     EVAL: URINARY FUNCTION: pt voids approx. Once an hour when drinking more water, on average every three hours. Pt has some pain when she voids, feels like she needs to go but doesn't always void when she gets to the toilet-not fully emptying. Pt has hx of yeast infections. Pt states stream is strong. Pt had epidurals and experienced urinary retention afterwards and it never fully recovered. SUI intermittently, before pregnancy (1-2x/time) and afterwards it was worse and now 1-2x/day again. Pt is breastfeeding baby, so she voids when she gets up at night. Hx of getting up about once/night prior to pregnancy. Pt used to wear pads pre-pregnancy but not recently-more of a dribble vs. Gush when leaking.  BOWEL FUNCTION: typically once a day and type 3-4 about 75% of the time. Pt  denied pain or hemorrhoids. No pressure or bulging.  CORE STABILITY: hx of chronic SIJ pain, hx of cholecystectomy in 2017, no MVAs or falls. Pt states her core feels weak postpartum, vaginal delivery. No tearing.  SEXUAL FUNCTION: pt reported pain with intercourse, initial and deep penetration. Stops intercourse from happening. Pt reported she does not feel that she has ever climaxed. She hasn't had pain during OBGYN exams in the past or tampon insertion, but hasn't attempted either postpartum.   Fluid intake: water in the am, Dr. Nunzio (one can/day), liquid IV with water, water (approx. 64 Oz.), sweet tea intermittently, no alcohol   PAIN:  Are you having pain? Yes 03/03/24 NPRS scale: 2/10 R hip pain  Better: repositioning while lying down and sometimes when she lies down it hurts even worse before it settles down.  Aggravating factors: holding her baby  EVAL. Pelvic pain during intercourse: 6-8/10 with initial and deep intercourse and then achy for the next few hours.    PRECAUTIONS: None  RED FLAGS: None   WEIGHT BEARING RESTRICTIONS: No  FALLS:  Has patient fallen in last 6 months? No  OCCUPATION: occupational therapist-full-time   ACTIVITY LEVEL : on her feet all day at work, has not started a workout plan. Walks every night pushing stroller about 0.5 mile (at least).    PLOF: Independent  PATIENT GOALS: Feel like she has more control of pelvic floor, stop pain during intercourse, and stop leakage (less important).  PERTINENT HISTORY:  Ehlers-danlos syndrome, anxiety and depression, 10 joint surgeries (last one in 2023 2/2 ED), hx of migraine, seasonal allergies, joint pain Sexual abuse: No  BOWEL MOVEMENT: Pain with bowel movement: No Type of bowel movement:Frequency once daily Fully empty rectum: No Leakage: No Pads: No Fiber supplement/laxative No  URINATION: Pain with urination: Yes Fully empty bladder: Nopainful when she doesn't fully empty Stream:  Strong Urgency: Yes  Frequency: once an hour to every three hours Leakage: Urge to void, Coughing, Sneezing, Laughing, and Lifting Pads: No but she used to wear pads  INTERCOURSE:  Ability to have vaginal penetration Yes  Pain with intercourse: Initial Penetration, During Penetration, Deep Penetration, After Intercourse, and Pain Interrupts Intercourse DrynessYes  Climax: no [name] Scale: 3/3   PREGNANCY: Vaginal deliveries 1 Tearing No Episiotomy No C-section deliveries 0 Currently pregnant No  PROLAPSE: None But felt pressure postpartum  OBJECTIVE:  Note: Objective measures were completed at Evaluation unless otherwise noted.   COGNITION: Overall cognitive status:  Within functional limits for tasks assessed     SENSATION: Light touch: Appears intact   GAIT: Assistive device utilized: None Comments: decr. Trunk rot  POSTURE: convex L tx spine curve, FHP, pt reported anterior pelvic tilt when carrying baby but was more neutral today incr. Postural sway during R and L SLS.   LUMBARAROM/PROM: WNL, L rot and sidebending incr. LBP and with ext  A/PROM A/PROM  eval  Flexion   Extension   Right lateral flexion   Left lateral flexion   Right rotation   Left rotation    (Blank rows = not tested)  LOWER EXTREMITY ROM: WFL  but B L IR and ER end range caused hip pain.  Active ROM Right eval Left eval  Hip flexion    Hip extension    Hip abduction    Hip adduction    Hip internal rotation    Hip external rotation    Knee flexion    Knee extension    Ankle dorsiflexion    Ankle plantarflexion    Ankle inversion    Ankle eversion     (Blank rows = not tested)  LOWER EXTREMITY MMT:  MMT Right eval Left eval  Hip flexion with pain in hip flexors 4- 4-  Hip extension    Hip abduction 3+ 3+  Hip adduction 4 3+  Hip internal rotation 4- 4-  Hip external rotation 4- 4-  Knee flexion 4- 4-  Knee extension 5 5  Ankle dorsiflexion 5 5  Ankle  plantarflexion    Ankle inversion    Ankle eversion     (Blank rows = not tested) PALPATION: TTP over thoracolumbar junction, B glutes (R>L side), B hamstrings (lateral L and R but L pain worse), difficulty palpating for PFM externally, so performed internal muscle assessment. Decr. Glute tension in R glute vs. L glute.  PELVIC MMT:   MMT eval  Vaginal   Internal Anal Sphincter   External Anal Sphincter   Puborectalis   Diastasis Recti   (Blank rows = not tested)        TONE: Decr. Glute tone, and incr. Tone in LLE   PROLAPSE: none   TODAY'S TREATMENT:                                                                                                                              DATE: 03/03/24    MANUAL THERAPY: PT performed STM and gentle cupping to B hip add. And R sided paraspinals, assessed L paraspinals but decr. Tension noted vs. R sided paraspinals and B hip add. Palpated R hip scars and noted incr. Adhesions. Pt reported less tension in back and hips when amb. After manual therapy. PT educated pt on how to perform gentle cupping to B hip add. In the shower, using water to decr. Friction. Pt did require rest break during L hip add. Cupping 2/2 L hip spasm that ceased with repositioning.  PATIENT EDUCATION:  Education details:PT educated pt on gentle cupping of add. And the importance of seeing a therapist for herself and with her spouse as she reported marital tension which can incr. Overall  Person educated: Patient Education method: Explanation, Demonstration, and Handouts Education comprehension: verbalized understanding and needs further education  HOME EXERCISE PROGRAM: [id number] [employer].  ASSESSMENT:  CLINICAL IMPRESSION: Skilled session focused on manual therapy and cupping education to decr. B hip add and R paraspinal fascia tension which can lead to musculature tension and incr. PFM tension. Pt met all STGs except for partially met STG 5 as she has not had  intercourse in 2 months. Pt demonstrated progress as she reported less tension after cupping. The following impairments continue to be noted upon exam: back/SIJ pain, postural dysfunction (more neutral to post. Pelvic tilt) , decr. Strength (especially hips and core) impaired SLS balance, pelvic pain, SUI. Pt would continue to benefit from skilled PT to improve safety and decr. Pain during all ADLs.   OBJECTIVE IMPAIRMENTS: Abnormal gait, decreased balance, decreased coordination, decreased strength, improper body mechanics, postural dysfunction, and pain.   ACTIVITY LIMITATIONS: carrying, lifting, bending, standing, squatting, continence, toileting, locomotion level, and caring for others  PARTICIPATION LIMITATIONS: meal prep, cleaning, laundry, interpersonal relationship, and occupation  PERSONAL FACTORS: Past/current experiences, Profession, and 3+ comorbidities: see above are also affecting patient's functional outcome.   REHAB POTENTIAL: Good  CLINICAL DECISION MAKING: Evolving/moderate complexity  EVALUATION COMPLEXITY: Moderate   GOALS: Goals reviewed with patient? Yes  SHORT TERM GOALS: Target date: for all STGS: 01/05/24. For STGs: 03/03/24  Pt will be IND in HEP to improve pain, strength, coordination. Baseline: no HEP; 9/22: performing intermittently  Goal status: MET  2.  Finish exam and write goals as indicated. Baseline: limited by time constraints Goal status: MET  3.  Pt will demo proper toileting posture to fully empty bladder. Baseline: unable to demo Goal status: MET  4.  Pt will demonstrated improved relaxation and contraction of PFM with coordination of breath to reduce urinary leakage to </=three/week. Baseline: 1-2x/day; 9/22: it was improving but this last week it's incr. To at least twice/week. 11/20: no leakage in the last two weeks Goal status: MET  5.  Pt will demonstrate improved relaxation and contraction of pelvic floor muscles (PFM) with  coordination of breath to decr. Pain to </=5/10 with intercourse with spouse. Baseline: 6-8/10 and stops intercourse. 9/22: 4-5/10 and 50/50 has to stop intercourse and still feels tense afterwards. 11/20: has not had intercourse since September Goal status: PARTIALLY MET   LONG TERM GOALS: Target date: 02/02/24. For LTGs: 03/31/24   Pt will demonstrate proper lifting technique and biomechanics when carrying baby, pushing stroller and performing ADLs to decr. Pain. Baseline: unable to demo, incr. APT per pt when lifting baby. 10/23: R hip pain and back pain still present Goal status: ON-GOING  2.  Pt will demonstrate improved relaxation and contraction of pelvic floor muscles (PFM) with coordination of breath to decr. Pain to </=1/10 with intercourse with spouse. Baseline: 6-8/10 and stops intercourse. 10/23: has not attempted recently Goal status: ON-GOING  3.  Pt will demonstrated improved relaxation and contraction of PFM with coordination of breath to reduce urinary leakage to </=once/week. Baseline: 1-2x/day, 10/23: no leakage this last week (but occurred the week prior) 11/20: no leakage in last two weeks Goal status: MET   PLAN: continue to progress core strength, internal muscle assessment and scar mobilization prn, review HEP (ensure relaxation) and added  to strengthening and paraspinal reactions.   PT FREQUENCY: every other week  PT DURATION: 8 weeks  PLANNED INTERVENTIONS: 97164- PT Re-evaluation, 97110-Therapeutic exercises, 97530- Therapeutic activity, 97112- Neuromuscular re-education, 97535- Self Care, 02859- Manual therapy, [phone]- Gait training, [phone] (1-2 muscles), 20561 (3+ muscles)- Dry Needling, Patient/Family education, Balance training, Taping, Joint mobilization, Spinal mobilization, Scar mobilization, Cryotherapy, Moist heat, and Biofeedback    Kennette Cuthrell L, PT 03/03/2024, 8:05 AM  Delon Pinal, PT,DPT 03/03/24 8:05 AM Phone: [phone] Fax:  [phone]

## 2024-03-08 ENCOUNTER — Encounter: Payer: Self-pay | Admitting: Certified Nurse Midwife

## 2024-03-15 ENCOUNTER — Encounter: Payer: Self-pay | Admitting: Certified Nurse Midwife

## 2024-03-17 ENCOUNTER — Ambulatory Visit

## 2024-03-29 ENCOUNTER — Encounter: Payer: Self-pay | Admitting: Certified Nurse Midwife

## 2024-03-29 NOTE — Progress Notes (Unsigned)
° ° °  NURSE VISIT NOTE  Subjective:    Patient ID: Alexa Grant, female    DOB: [birth year], 28 y.o.   MRN: [id number]       HPI  Patient is a 28 y.o. G36P1001 female who presents for {UTI Symptoms:210800002} for {0-10:33138} {TIME; UNITS DAY/WEEK/MONTH:19136}.  Patient denies {UTI Symptoms:210800002}.  Patient {does/does not:33181} have a history of recurrent UTI.  Patient {does/does not:33181} have a history of pyelonephritis.    Objective:    There were no vitals taken for this visit.   Lab Review  No results found for any visits on 03/30/24.  Assessment:   No diagnosis found.   Plan:   {AOB UTI PLAN:28528:p}   Harlene Gander, CMA

## 2024-03-30 ENCOUNTER — Ambulatory Visit

## 2024-03-30 VITALS — BP 91/59 | HR 77 | Ht 65.0 in | Wt 150.0 lb

## 2024-03-30 DIAGNOSIS — R300 Dysuria: Secondary | ICD-10-CM | POA: Diagnosis not present

## 2024-03-30 LAB — POCT URINALYSIS DIPSTICK
Bilirubin, UA: NEGATIVE
Glucose, UA: NEGATIVE
Ketones, UA: NEGATIVE
Leukocytes, UA: NEGATIVE
Nitrite, UA: NEGATIVE
Protein, UA: NEGATIVE
Spec Grav, UA: 1.01 (ref 1.010–1.025)
Urobilinogen, UA: 0.2 U/dL
pH, UA: 6 (ref 5.0–8.0)

## 2024-03-31 ENCOUNTER — Other Ambulatory Visit: Payer: Self-pay

## 2024-03-31 ENCOUNTER — Encounter

## 2024-03-31 ENCOUNTER — Ambulatory Visit: Attending: Certified Nurse Midwife

## 2024-03-31 DIAGNOSIS — M6281 Muscle weakness (generalized): Secondary | ICD-10-CM | POA: Insufficient documentation

## 2024-03-31 DIAGNOSIS — R102 Pelvic and perineal pain unspecified side: Secondary | ICD-10-CM | POA: Diagnosis present

## 2024-03-31 DIAGNOSIS — N393 Stress incontinence (female) (male): Secondary | ICD-10-CM | POA: Diagnosis present

## 2024-03-31 DIAGNOSIS — R278 Other lack of coordination: Secondary | ICD-10-CM | POA: Diagnosis present

## 2024-03-31 DIAGNOSIS — R2689 Other abnormalities of gait and mobility: Secondary | ICD-10-CM | POA: Insufficient documentation

## 2024-03-31 NOTE — Therapy (Signed)
 OUTPATIENT PHYSICAL THERAPY FEMALE PELVIC TREATMENT/discharge     Patient Name: Alexa Grant MRN: [id number] DOB:1995-08-05, 28 y.o., female Today's Date: 03/31/2024  END OF SESSION:  PT End of Session - 01/12/24 0940     Visit Number 12   Number of Visits 12   Date for Recertification  04/04/24    Authorization Type [employer]    Progress Note Due on Visit 10    PT Start Time 0849    PT Stop Time 0921    PT Time Calculation (min)  32 min    Activity Tolerance Patient tolerated treatment well    Behavior During Therapy [facility] for tasks assessed/performed          Past Medical History:  Diagnosis Date   Biliary dyskinesia 01/25/2016   Family history of adverse reaction to anesthesia    dad gets sick with anesthesia   Gastric ulcer    GERD (gastroesophageal reflux disease)    takes Pantoprazole  daily   Headache    History of migraine    few yrs ago   Joint pain    PONV (postoperative nausea and vomiting)    Seasonal allergies    Tylenol  Sinus and Nasonex as needed   Supervision of high risk pregnancy, antepartum 12/10/2022              Clinical Staff    Provider      Office Location      Ob/Gyn    Dating     8/19 [redacted]w[redacted]d      Language     English    Anatomy US             Flu Vaccine     UTD    Genetic Screen     NIPS: low risk, XX      TDaP vaccine      offer    Hgb A1C or   GTT    Early :  Third trimester :       Covid    One booster         LAB RESULTS       Rhogam     A/Positive/-- (09/16 0849)     Blood Type    Vaccine for human papilloma virus (HPV) types 6, 11, 16, and 18 administered    Past Surgical History:  Procedure Laterality Date   CHOLECYSTECTOMY N/A 01/25/2016   Procedure: LAPAROSCOPIC CHOLECYSTECTOMY WITH INTRAOPERATIVE CHOLANGIOGRAM;  Surgeon: [name], MD;  Location: [facility] OR;  Service: General;  Laterality: N/A;   COLONOSCOPY WITH ESOPHAGOGASTRODUODENOSCOPY (EGD)     HIP SURGERY Bilateral    labrum repair   HIP SURGERY Right 2023   remove  hardware   SHOULDER SURGERY Bilateral    labrum repair   Patient Active Problem List   Diagnosis Date Noted   Acute reaction to situational stress 12/18/2023   Rubella non-immune status, antepartum 03/02/2023   Ehlers-Danlos disease 12/31/2022   Anxiety and depression 05/13/2019   Biliary dyskinesia 01/25/2016    PCP: Dr. Oneil Pinal  REFERRING PROVIDER: [name], CNM  REFERRING DIAG: 11/25/23  THERAPY DIAG:  Muscle weakness (generalized)  Other lack of coordination  Pelvic pain  Other abnormalities of gait and mobility  Urinary, incontinence, stress female  Rationale for Evaluation and Treatment: Rehabilitation  ONSET DATE: 11/25/23: referral date, but onset since childhood but postpartum incr. S/s (07/19/23)  SUBJECTIVE:  SUBJECTIVE STATEMENT: Pt reported she had intercourse and did not experience pain during intercourse. She is going to couples therapy and it's really helping. She had intercourse two days in a row but feels like she might have a UTI and is awaiting results. Pt reported her R hip is not as bad. Pt reported 85% improvement on GROC scale since starting PHPT. The last 15% is staying consistent. Pt has been doing gentle cupping on hip scars and teaching husband to cup back.     EVAL: URINARY FUNCTION: pt voids approx. Once an hour when drinking more water, on average every three hours. Pt has some pain when she voids, feels like she needs to go but doesn't always void when she gets to the toilet-not fully emptying. Pt has hx of yeast infections. Pt states stream is strong. Pt had epidurals and experienced urinary retention afterwards and it never fully recovered. SUI intermittently, before pregnancy (1-2x/time) and afterwards it was worse and now 1-2x/day again. Pt is  breastfeeding baby, so she voids when she gets up at night. Hx of getting up about once/night prior to pregnancy. Pt used to wear pads pre-pregnancy but not recently-more of a dribble vs. Gush when leaking.  BOWEL FUNCTION: typically once a day and type 3-4 about 75% of the time. Pt denied pain or hemorrhoids. No pressure or bulging.  CORE STABILITY: hx of chronic SIJ pain, hx of cholecystectomy in 2017, no MVAs or falls. Pt states her core feels weak postpartum, vaginal delivery. No tearing.  SEXUAL FUNCTION: pt reported pain with intercourse, initial and deep penetration. Stops intercourse from happening. Pt reported she does not feel that she has ever climaxed. She hasn't had pain during OBGYN exams in the past or tampon insertion, but hasn't attempted either postpartum.   Fluid intake: water in the am, Dr. Nunzio (one can/day), liquid IV with water, water (approx. 64 Oz.), sweet tea intermittently, no alcohol   PAIN:  Are you having pain? Yes 03/31/24 NPRS scale:  3/10 back pain  Better: when she's stretching  Aggravating factors: TTP  EVAL. Pelvic pain during intercourse: 6-8/10 with initial and deep intercourse and then achy for the next few hours.    PRECAUTIONS: None  RED FLAGS: None   WEIGHT BEARING RESTRICTIONS: No  FALLS:  Has patient fallen in last 6 months? No  OCCUPATION: occupational therapist-full-time   ACTIVITY LEVEL : on her feet all day at work, has not started a workout plan. Walks every night pushing stroller about 0.5 mile (at least).    PLOF: Independent  PATIENT GOALS: Feel like she has more control of pelvic floor, stop pain during intercourse, and stop leakage (less important).  PERTINENT HISTORY:  Ehlers-danlos syndrome, anxiety and depression, 10 joint surgeries (last one in 2023 2/2 ED), hx of migraine, seasonal allergies, joint pain Sexual abuse: No  BOWEL MOVEMENT: Pain with bowel movement: No Type of bowel movement:Frequency once daily Fully  empty rectum: No Leakage: No Pads: No Fiber supplement/laxative No  URINATION: Pain with urination: Yes Fully empty bladder: Nopainful when she doesn't fully empty Stream: Strong Urgency: Yes  Frequency: once an hour to every three hours Leakage: Urge to void, Coughing, Sneezing, Laughing, and Lifting Pads: No but she used to wear pads  INTERCOURSE:  Ability to have vaginal penetration Yes  Pain with intercourse: Initial Penetration, During Penetration, Deep Penetration, After Intercourse, and Pain Interrupts Intercourse DrynessYes  Climax: no [name] Scale: 3/3   PREGNANCY: Vaginal deliveries 1 Tearing No Episiotomy No  C-section deliveries 0 Currently pregnant No  PROLAPSE: None But felt pressure postpartum  OBJECTIVE:  Note: Objective measures were completed at Evaluation unless otherwise noted.   COGNITION: Overall cognitive status: Within functional limits for tasks assessed     SENSATION: Light touch: Appears intact   GAIT: Assistive device utilized: None Comments: decr. Trunk rot  POSTURE: convex L tx spine curve, FHP, pt reported anterior pelvic tilt when carrying baby but was more neutral today incr. Postural sway during R and L SLS.   LUMBARAROM/PROM: WNL, L rot and sidebending incr. LBP and with ext  A/PROM A/PROM  eval  Flexion   Extension   Right lateral flexion   Left lateral flexion   Right rotation   Left rotation    (Blank rows = not tested)  LOWER EXTREMITY ROM: WFL  but B L IR and ER end range caused hip pain.  Active ROM Right eval Left eval  Hip flexion    Hip extension    Hip abduction    Hip adduction    Hip internal rotation    Hip external rotation    Knee flexion    Knee extension    Ankle dorsiflexion    Ankle plantarflexion    Ankle inversion    Ankle eversion     (Blank rows = not tested)  LOWER EXTREMITY MMT:  MMT Right eval Left eval  Hip flexion with pain in hip flexors 4- 4-  Hip extension    Hip  abduction 3+ 3+  Hip adduction 4 3+  Hip internal rotation 4- 4-  Hip external rotation 4- 4-  Knee flexion 4- 4-  Knee extension 5 5  Ankle dorsiflexion 5 5  Ankle plantarflexion    Ankle inversion    Ankle eversion     (Blank rows = not tested) PALPATION: TTP over thoracolumbar junction, B glutes (R>L side), B hamstrings (lateral L and R but L pain worse), difficulty palpating for PFM externally, so performed internal muscle assessment. Decr. Glute tension in R glute vs. L glute.  PELVIC MMT:   MMT eval  Vaginal   Internal Anal Sphincter   External Anal Sphincter   Puborectalis   Diastasis Recti   (Blank rows = not tested)        TONE: Decr. Glute tone, and incr. Tone in LLE   PROLAPSE: none   TODAY'S TREATMENT:                                                                                                                              DATE: 03/31/24    THEREX: reviewed barre activities and how to modify and focus on breathwork, decr. Neck activation during core work. Demonstrated how to use peloton app to perform at home. PT educated pt on when to perform HEP stretches, mobility, strength and how to progress (printed out and reviewed it with pt) and how to start barre.  PATIENT EDUCATION:  Education details:  PT educated pt goal progress and d/c. Pt agreeable.  Person educated: Patient Education method: Explanation, Demonstration, and Handouts Education comprehension: verbalized understanding and needs further education  HOME EXERCISE PROGRAM: [id number] medbridge.  ASSESSMENT:  CLINICAL IMPRESSION: See d/c summary.  OBJECTIVE IMPAIRMENTS: Abnormal gait, decreased balance, decreased coordination, decreased strength, improper body mechanics, postural dysfunction, and pain.   ACTIVITY LIMITATIONS: carrying, lifting, bending, standing, squatting, continence, toileting, locomotion level, and caring for others  PARTICIPATION LIMITATIONS: meal prep, cleaning, laundry,  interpersonal relationship, and occupation  PERSONAL FACTORS: Past/current experiences, Profession, and 3+ comorbidities: see above are also affecting patient's functional outcome.   REHAB POTENTIAL: Good  CLINICAL DECISION MAKING: Evolving/moderate complexity  EVALUATION COMPLEXITY: Moderate   GOALS: Goals reviewed with patient? Yes  SHORT TERM GOALS: Target date: for all STGS: 01/05/24. For STGs: 03/03/24  Pt will be IND in HEP to improve pain, strength, coordination. Baseline: no HEP; 9/22: performing intermittently  Goal status: MET  2.  Finish exam and write goals as indicated. Baseline: limited by time constraints Goal status: MET  3.  Pt will demo proper toileting posture to fully empty bladder. Baseline: unable to demo Goal status: MET  4.  Pt will demonstrated improved relaxation and contraction of PFM with coordination of breath to reduce urinary leakage to </=three/week. Baseline: 1-2x/day; 9/22: it was improving but this last week it's incr. To at least twice/week. 11/20: no leakage in the last two weeks Goal status: MET  5.  Pt will demonstrate improved relaxation and contraction of pelvic floor muscles (PFM) with coordination of breath to decr. Pain to </=5/10 with intercourse with spouse. Baseline: 6-8/10 and stops intercourse. 9/22: 4-5/10 and 50/50 has to stop intercourse and still feels tense afterwards. 11/20: has not had intercourse since September; 12/18: 0/10 during intercourse or afterwards Goal status: MET   LONG TERM GOALS: Target date: 02/02/24. For LTGs: 03/31/24   Pt will demonstrate proper lifting technique and biomechanics when carrying baby, pushing stroller and performing ADLs to decr. Pain. Baseline: unable to demo, incr. APT per pt when lifting baby. 10/23: R hip pain and back pain still present 12/18: R hip pain is improving and back pain is about the same.  Goal status: PARTIALLY MET  2.  Pt will demonstrate improved relaxation and  contraction of pelvic floor muscles (PFM) with coordination of breath to decr. Pain to </=1/10 with intercourse with spouse. Baseline: 6-8/10 and stops intercourse. 10/23: has not attempted recently; 12/18: 0/10 with intercourse Goal status: MET  3.  Pt will demonstrated improved relaxation and contraction of PFM with coordination of breath to reduce urinary leakage to </=once/week. Baseline: 1-2x/day, 10/23: no leakage this last week (but occurred the week prior) 11/20: no leakage in last two weeks 12/18: still met but did have some UTI s/s this week.  Goal status: MET   PLAN: d/c    PT FREQUENCY: every other week  PT DURATION: 8 weeks  PLANNED INTERVENTIONS: 97164- PT Re-evaluation, 97110-Therapeutic exercises, 97530- Therapeutic activity, 97112- Neuromuscular re-education, 97535- Self Care, [id number]- Manual therapy, [phone]- Gait training, [phone] (1-2 muscles), 20561 (3+ muscles)- Dry Needling, Patient/Family education, Balance training, Taping, Joint mobilization, Spinal mobilization, Scar mobilization, Cryotherapy, Moist heat, and Biofeedback  PHYSICAL THERAPY DISCHARGE SUMMARY  Visits from Start of Care: 12  Current functional level related to goals / functional outcomes: All met except for back pain goal.   Remaining deficits: Mild back pain   Education / Equipment: HEP and pelvic wand ed   Patient agrees to  discharge. Patient goals were met. Patient is being discharged due to meeting the stated rehab goals. And pleased with progress.   Jailene Cupit L, PT 03/31/2024, 8:52 AM  Delon Pinal, PT,DPT 03/31/2024 8:52 AM Phone: [phone] Fax: [phone]

## 2024-04-01 LAB — URINE CULTURE: Organism ID, Bacteria: NO GROWTH

## 2024-04-04 ENCOUNTER — Ambulatory Visit: Admitting: Obstetrics & Gynecology

## 2024-04-12 ENCOUNTER — Ambulatory Visit

## 2024-04-19 ENCOUNTER — Telehealth: Payer: Self-pay

## 2024-04-19 DIAGNOSIS — N61 Mastitis without abscess: Secondary | ICD-10-CM

## 2024-04-19 DIAGNOSIS — B379 Candidiasis, unspecified: Secondary | ICD-10-CM

## 2024-04-19 MED ORDER — FLUCONAZOLE 150 MG PO TABS: 150.0000 mg | ORAL_TABLET | Freq: Once | ORAL | 1 refills | Status: AC

## 2024-04-19 MED ORDER — CEPHALEXIN 500 MG PO CAPS: 500.0000 mg | ORAL_CAPSULE | Freq: Four times a day (QID) | ORAL | 0 refills | Status: AC

## 2024-04-19 NOTE — Telephone Encounter (Signed)
 Patient states she noticed last night she had pain in right breast and soreness around the nipple. She woke up this morning with it hurting worse with redness and warm to touch. She is now having chills/body aches. She states this is the 8th time she has had this since delivery. She would like meds but also would like appointment to discuss why this keeps happening.  Mastitis  Assessment: -    Patient is postpartum, is Breastfeeding. denies vomiting, abdominal pain, fussiness, diarrhea, cough, and difficulty breathing Pain/redness of breast tissue , fever/chills, body aches  , and Breast fullness, firmness with pain and tenderness  ,    Fever >100.5 unknown, doesn't have thermometer, Feels hot/cold  Plan (Mastitis above symptoms) -  Continue to feed on baby's schedule., Ensure proper latching.  , Continue to feed on both breasts.  , Tylenol  or Advil for discomfort.  , Place cold compress to the affected area   , Increase PO fluids and rest , and Contact office if signs and symptoms worsen or no improvement after 24-48 hours of antibiotics    Allergies Verified Yes   Treatment: Cephalexin  500 mg PO 4 times daily x 14 days

## 2024-04-19 NOTE — Telephone Encounter (Signed)
 Spoke with [name] to discuss. Patient advised per Harlene that she will put in a referral to Outpatient Lactation. They will contact her to schedule. Advised rx sent in for cephalexin . Patient request rx for Diflucan  as she is prone to yeast infections with abx treatment. Rx sent. Patient also sent information via my chart regarding lethicin use (TID) and recurrent mastitis.

## 2024-04-21 ENCOUNTER — Ambulatory Visit: Admitting: Certified Nurse Midwife
